# Patient Record
Sex: Male | Born: 1980
Health system: Southern US, Community
[De-identification: ages and names within clinical notes are randomized; demographics above are authoritative.]

## PROBLEM LIST (undated history)

## (undated) DIAGNOSIS — R7303 Prediabetes: Secondary | ICD-10-CM

## (undated) DIAGNOSIS — Z8669 Personal history of other diseases of the nervous system and sense organs: Secondary | ICD-10-CM

## (undated) DIAGNOSIS — K5900 Constipation, unspecified: Secondary | ICD-10-CM

## (undated) DIAGNOSIS — K219 Gastro-esophageal reflux disease without esophagitis: Secondary | ICD-10-CM

## (undated) DIAGNOSIS — I10 Essential (primary) hypertension: Secondary | ICD-10-CM

## (undated) DIAGNOSIS — N433 Hydrocele, unspecified: Secondary | ICD-10-CM

## (undated) DIAGNOSIS — E669 Obesity, unspecified: Secondary | ICD-10-CM

## (undated) DIAGNOSIS — E119 Type 2 diabetes mellitus without complications: Secondary | ICD-10-CM

## (undated) HISTORY — DX: Prediabetes: R73.03

## (undated) HISTORY — PX: NO PAST SURGERIES: SHX2092

---

## 2011-05-12 ENCOUNTER — Encounter: Payer: Self-pay | Admitting: Emergency Medicine

## 2011-05-12 ENCOUNTER — Emergency Department (HOSPITAL_COMMUNITY)
Admission: EM | Admit: 2011-05-12 | Discharge: 2011-05-12 | Disposition: A | Discharge status: Home / Self Care | Payer: Self-pay | Attending: Emergency Medicine | Admitting: Emergency Medicine

## 2011-05-12 DIAGNOSIS — R05 Cough: Secondary | ICD-10-CM | POA: Insufficient documentation

## 2011-05-12 DIAGNOSIS — R197 Diarrhea, unspecified: Secondary | ICD-10-CM | POA: Insufficient documentation

## 2011-05-12 DIAGNOSIS — B349 Viral infection, unspecified: Secondary | ICD-10-CM

## 2011-05-12 DIAGNOSIS — J3489 Other specified disorders of nose and nasal sinuses: Secondary | ICD-10-CM | POA: Insufficient documentation

## 2011-05-12 DIAGNOSIS — R6889 Other general symptoms and signs: Secondary | ICD-10-CM | POA: Insufficient documentation

## 2011-05-12 DIAGNOSIS — B9789 Other viral agents as the cause of diseases classified elsewhere: Secondary | ICD-10-CM | POA: Insufficient documentation

## 2011-05-12 DIAGNOSIS — R109 Unspecified abdominal pain: Secondary | ICD-10-CM | POA: Insufficient documentation

## 2011-05-12 DIAGNOSIS — R0989 Other specified symptoms and signs involving the circulatory and respiratory systems: Secondary | ICD-10-CM | POA: Insufficient documentation

## 2011-05-12 DIAGNOSIS — R059 Cough, unspecified: Secondary | ICD-10-CM | POA: Insufficient documentation

## 2011-05-12 DIAGNOSIS — R070 Pain in throat: Secondary | ICD-10-CM | POA: Insufficient documentation

## 2011-05-12 MED ORDER — HYDROCOD POLST-CHLORPHEN POLST 10-8 MG/5ML PO LQCR: 5.0000 mL | Freq: Two times a day (BID) | ORAL | Status: DC | PRN

## 2011-05-12 NOTE — ED Provider Notes (Signed)
Medical screening examination/treatment/procedure(s) were performed by non-physician practitioner and as supervising physician I was immediately available for consultation/collaboration.  Kadejah Sandiford, MD 05/12/11 1754 

## 2011-05-12 NOTE — ED Notes (Signed)
Pt here with body aches and chills with congestion and cough x 4 days

## 2011-05-12 NOTE — ED Provider Notes (Signed)
History     CSN: [id number]  Arrival date & time 05/12/11  4098   First MD Initiated Contact with Patient 05/12/11 [phone]      Chief Complaint  Patient presents with  . Influenza    (Consider location/radiation/quality/duration/timing/severity/associated sxs/prior treatment) HPI  31 year old male presenting to the ED with flulike symptoms. Patient has been complaining of rhinorrhea, sore throat, sneezing, dry cough, abdominal discomfort, chest congestion, and diarrhea for the past 5 days. Patient states that his symptoms are improving however he continues to endorse generalized body aches. He is able to tolerate by mouth and able to keep his food down. His contact in ibuprofen at home without adequate relief. He continues to endorse a dry cough. He has not had his flu shot this year. He denies fever, or rash.  History reviewed. No pertinent past medical history.  History reviewed. No pertinent past surgical history.  History reviewed. No pertinent family history.  History  Substance Use Topics  . Smoking status: Current Everyday Smoker  . Smokeless tobacco: Not on file  . Alcohol Use: No      Review of Systems  All other systems reviewed and are negative.    Allergies  Review of patient's allergies indicates no known allergies.  Home Medications   Current Outpatient Rx  Name Route Sig Dispense Refill  . IBUPROFEN 200 MG PO TABS Oral Take 800 mg by mouth 2 (two) times daily as needed. For pain       BP 152/92  Pulse 79  Temp(Src) 99 F (37.2 C) (Oral)  Resp 18  SpO2 96%  Physical Exam  Nursing note and vitals reviewed. Constitutional: He appears well-developed and well-nourished. No distress.       Awake, alert, nontoxic appearance  HENT:  Head: Atraumatic.  Right Ear: External ear normal.  Left Ear: External ear normal.  Nose: Rhinorrhea present.  Mouth/Throat: Oropharynx is clear and moist. No oropharyngeal exudate.  Eyes: Right eye exhibits no  discharge. Left eye exhibits no discharge.  Neck: Neck supple.  Pulmonary/Chest: Effort normal. He exhibits no tenderness.  Abdominal: Soft. There is no tenderness. There is no rebound.  Musculoskeletal: He exhibits no tenderness.       Baseline ROM, no obvious new focal weakness  Neurological: He is alert.       Mental status and motor strength appears baseline for patient and situation  Skin: No rash noted.  Psychiatric: He has a normal mood and affect.    ED Course  Procedures (including critical care time)  Labs Reviewed - No data to display No results found.   No diagnosis found.    MDM  Upper respiratory infection likely. Patient is afebrile. His vital signs stable. He is able to tolerate by mouth. Reassurance given. The patient requests for a work note.  Will prescribe cough medicine.          Fayrene Helper, [place] 05/12/11 [phone]

## 2011-08-04 ENCOUNTER — Encounter (HOSPITAL_COMMUNITY): Payer: Self-pay | Admitting: Emergency Medicine

## 2011-08-04 ENCOUNTER — Emergency Department (HOSPITAL_COMMUNITY)
Admission: EM | Admit: 2011-08-04 | Discharge: 2011-08-04 | Disposition: A | Discharge status: Home / Self Care | Payer: 59 | Attending: Emergency Medicine | Admitting: Emergency Medicine

## 2011-08-04 ENCOUNTER — Emergency Department (HOSPITAL_COMMUNITY): Payer: 59

## 2011-08-04 DIAGNOSIS — Y92009 Unspecified place in unspecified non-institutional (private) residence as the place of occurrence of the external cause: Secondary | ICD-10-CM | POA: Insufficient documentation

## 2011-08-04 DIAGNOSIS — S93401A Sprain of unspecified ligament of right ankle, initial encounter: Secondary | ICD-10-CM

## 2011-08-04 DIAGNOSIS — W010XXA Fall on same level from slipping, tripping and stumbling without subsequent striking against object, initial encounter: Secondary | ICD-10-CM | POA: Insufficient documentation

## 2011-08-04 DIAGNOSIS — S93409A Sprain of unspecified ligament of unspecified ankle, initial encounter: Secondary | ICD-10-CM | POA: Insufficient documentation

## 2011-08-04 MED ORDER — TRAMADOL HCL 50 MG PO TABS: 50.0000 mg | ORAL_TABLET | Freq: Four times a day (QID) | ORAL | Status: AC | PRN
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
## 2011-08-04 NOTE — Progress Notes (Signed)
Orthopedic Tech Progress Note Patient Details:  Ricky Zuniga 1981/01/13 [id number]  Other Ortho Devices Type of Ortho Device: Crutches;ASO Ortho Device Interventions: Application;Adjustment   Shawnie Pons 08/04/2011, 9:43 AM

## 2011-08-04 NOTE — Discharge Instructions (Signed)
Ankle Sprain An ankle sprain is an injury to the strong, fibrous tissues (ligaments) that hold the bones of your ankle joint together.  CAUSES Ankle sprain usually is caused by a fall or by twisting your ankle. People who participate in sports are more prone to these types of injuries.  SYMPTOMS  Symptoms of ankle sprain include:  Pain in your ankle. The pain may be present at rest or only when you are trying to stand or walk.   Swelling.   Bruising. Bruising may develop immediately or within 1 to 2 days after your injury.   Difficulty standing or walking.  DIAGNOSIS  Your caregiver will ask you details about your injury and perform a physical exam of your ankle to determine if you have an ankle sprain. During the physical exam, your caregiver will press and squeeze specific areas of your foot and ankle. Your caregiver will try to move your ankle in certain ways. An X-ray exam may be done to be sure a bone was not broken or a ligament did not separate from one of the bones in your ankle (avulsion).  TREATMENT  Certain types of braces can help stabilize your ankle. Your caregiver can make a recommendation for this. Your caregiver may recommend the use of medication for pain. If your sprain is severe, your caregiver may refer you to a surgeon who helps to restore function to parts of your skeletal system (orthopedist) or a physical therapist. HOME CARE INSTRUCTIONS  Apply ice to your injury for 1 to 2 days or as directed by your caregiver. Applying ice helps to reduce inflammation and pain.  Put ice in a plastic bag.   Place a towel between your skin and the bag.   Leave the ice on for 15 to 20 minutes at a time, every 2 hours while you are awake.   Take over-the-counter or prescription medicines for pain, discomfort, or fever only as directed by your caregiver.   Keep your injured leg elevated, when possible, to lessen swelling.   If your caregiver recommends crutches, use them as  instructed. Gradually, put weight on the affected ankle. Continue to use crutches or a cane until you can walk without feeling pain in your ankle.   If you have a plaster splint, wear the splint as directed by your caregiver. Do not rest it on anything harder than a pillow the first 24 hours. Do not put weight on it. Do not get it wet. You may take it off to take a shower or bath.   You may have been given an elastic bandage to wear around your ankle to provide support. If the elastic bandage is too tight (you have numbness or tingling in your foot or your foot becomes cold and blue), adjust the bandage to make it comfortable.   If you have an air splint, you may blow more air into it or let air out to make it more comfortable. You may take your splint off at night and before taking a shower or bath.   Wiggle your toes in the splint several times per day if you are able.  SEEK MEDICAL CARE IF:   You have an increase in bruising, swelling, or pain.   Your toes feel cold.   Pain relief is not achieved with medication.  SEEK IMMEDIATE MEDICAL CARE IF: Your toes are numb or blue or you have severe pain. MAKE SURE YOU:   Understand these instructions.   Will watch your condition.     Will get help right away if you are not doing well or get worse.  Document Released: 04/21/2005 Document Revised: 04/10/2011 Document Reviewed: 11/24/2007 ExitCare Patient Information 2012 [employer]. 

## 2011-08-04 NOTE — ED Provider Notes (Signed)
Medical screening examination/treatment/procedure(s) were performed by non-physician practitioner and as supervising physician I was immediately available for consultation/collaboration.  Hardin Hardenbrook, MD 08/04/11 1529 

## 2011-08-04 NOTE — ED Provider Notes (Signed)
History     CSN: [id number]  Arrival date & time 08/04/11  0849   First MD Initiated Contact with Patient 08/04/11 0902      8:54AM HPI Patient reports 2 nights ago he was walking on his porch when he tripped and twisted his right ankle falling off a porch. Reports since then has had persistent pain at the right lateral ankle. Reports swelling above his right lateral malleolus. Denies numbness, tingling, weakness, inability to bear weight, decreased range of motion. Patient is a 31 y.o. male presenting with ankle pain. The history is provided by the patient.  Ankle Pain  The incident occurred 2 days ago. The incident occurred at home. The injury mechanism was a fall and torsion. The pain is present in the right ankle. The quality of the pain is described as aching. The pain is moderate. The pain has been constant since onset. Pertinent negatives include no numbness, no inability to bear weight, no loss of motion, no muscle weakness, no loss of sensation and no tingling. He reports no foreign bodies present. The symptoms are aggravated by activity, bearing weight and palpation. He has tried immobilization, rest and elevation for the symptoms. The treatment provided no relief.    History reviewed. No pertinent past medical history.  History reviewed. No pertinent past surgical history.  History reviewed. No pertinent family history.  History  Substance Use Topics  . Smoking status: Current Everyday Smoker  . Smokeless tobacco: Not on file  . Alcohol Use: No      Review of Systems  Musculoskeletal: Negative for back pain.       Ankle pain and swelling  Skin: Negative for color change, pallor and rash.  Neurological: Negative for tingling, weakness and numbness.  All other systems reviewed and are negative.    Allergies  Review of patient's allergies indicates no known allergies.  Home Medications   Current Outpatient Rx  Name Route Sig Dispense Refill  . IBUPROFEN 200 MG PO  TABS Oral Take 800 mg by mouth 2 (two) times daily as needed. For pain       BP 138/93  Pulse 72  Temp(Src) 98.1 F (36.7 C) (Oral)  Resp 20  SpO2 97%  Physical Exam  Constitutional: He is oriented to person, place, and time. He appears well-developed and well-nourished.  HENT:  Head: Normocephalic and atraumatic.  Eyes: Pupils are equal, round, and reactive to light.  Musculoskeletal:       Right ankle: He exhibits swelling. He exhibits normal range of motion, no ecchymosis, no deformity, no laceration and normal pulse. Tenderness: superior to lateral malleolus  No lateral malleolus, no medial malleolus, no AITFL, no CF ligament, no posterior TFL, no head of 5th metatarsal and no proximal fibula tenderness found. Achilles tendon normal.       Feet:  Neurological: He is alert and oriented to person, place, and time.  Skin: Skin is warm and dry. No rash noted. No erythema. No pallor.  Psychiatric: He has a normal mood and affect. His behavior is normal.    ED Course  Procedures   Dg Ankle Complete Right  08/04/2011  *RADIOLOGY REPORT*  Clinical Data: Pain and swelling secondary to a twisting injury.  RIGHT ANKLE - COMPLETE 3+ VIEW  Comparison: None.  Findings: There is no fracture or dislocation.  Soft tissue swelling around the ankle joint with a small ankle effusion.  IMPRESSION: No acute osseous abnormality.  Ankle effusion.  Original Report Authenticated By: Allayne Gitelman.  MAXWELL, M.D.     MDM   Will place patient in an ASO and crutches. Patient likely has a right ankle sprain. Advised followup with workup if persistent pain despite conservative therapy. Patient agrees to plan and is ready for discharge      Thomasene Lot, Cordelia Poche 08/04/11 9147

## 2011-08-04 NOTE — ED Notes (Signed)
Pt fell off porch on Sunday morning.  Denies LOC.  Reports (R) ankle pain and swelling-sensation, cap refill present.  Pt ambulatory with a limp.  Pt has ace bandage on ankle.

## 2013-01-13 ENCOUNTER — Encounter (HOSPITAL_COMMUNITY): Payer: Self-pay | Admitting: Emergency Medicine

## 2013-01-13 ENCOUNTER — Emergency Department (HOSPITAL_COMMUNITY)
Admission: EM | Admit: 2013-01-13 | Discharge: 2013-01-13 | Disposition: A | Discharge status: Home / Self Care | Payer: No Typology Code available for payment source | Attending: Emergency Medicine | Admitting: Emergency Medicine

## 2013-01-13 ENCOUNTER — Emergency Department (HOSPITAL_COMMUNITY): Payer: No Typology Code available for payment source

## 2013-01-13 DIAGNOSIS — F17200 Nicotine dependence, unspecified, uncomplicated: Secondary | ICD-10-CM | POA: Insufficient documentation

## 2013-01-13 DIAGNOSIS — Y9389 Activity, other specified: Secondary | ICD-10-CM | POA: Insufficient documentation

## 2013-01-13 DIAGNOSIS — I10 Essential (primary) hypertension: Secondary | ICD-10-CM | POA: Insufficient documentation

## 2013-01-13 DIAGNOSIS — M542 Cervicalgia: Secondary | ICD-10-CM

## 2013-01-13 DIAGNOSIS — Y92410 Unspecified street and highway as the place of occurrence of the external cause: Secondary | ICD-10-CM | POA: Insufficient documentation

## 2013-01-13 DIAGNOSIS — M549 Dorsalgia, unspecified: Secondary | ICD-10-CM

## 2013-01-13 DIAGNOSIS — S0993XA Unspecified injury of face, initial encounter: Secondary | ICD-10-CM | POA: Insufficient documentation

## 2013-01-13 DIAGNOSIS — IMO0002 Reserved for concepts with insufficient information to code with codable children: Secondary | ICD-10-CM | POA: Insufficient documentation

## 2013-01-13 HISTORY — DX: Essential (primary) hypertension: I10

## 2013-01-13 MED ORDER — CYCLOBENZAPRINE HCL 5 MG PO TABS: 5.0000 mg | ORAL_TABLET | Freq: Three times a day (TID) | ORAL | Status: DC | PRN
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
## 2013-01-13 NOTE — Progress Notes (Signed)
pcp is [facility] with Dr Mosie Epstein and T anderson-mclinton, NP EPIC updated
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
## 2013-01-16 NOTE — ED Provider Notes (Signed)
Medical screening examination/treatment/procedure(s) were performed by non-physician practitioner and as supervising physician I was immediately available for consultation/collaboration.  Alline Pio N Raford Brissett, DO 01/16/13 1400 

## 2013-05-19 ENCOUNTER — Encounter: Payer: Self-pay | Admitting: Family Medicine

## 2013-05-20 ENCOUNTER — Ambulatory Visit (INDEPENDENT_AMBULATORY_CARE_PROVIDER_SITE_OTHER): Payer: 59 | Admitting: Family Medicine

## 2013-05-20 ENCOUNTER — Encounter: Payer: Self-pay | Admitting: Family Medicine

## 2013-05-20 VITALS — BP 139/92 | HR 62 | Temp 97.6°F | Ht 67.0 in | Wt 286.0 lb

## 2013-05-20 DIAGNOSIS — R030 Elevated blood-pressure reading, without diagnosis of hypertension: Secondary | ICD-10-CM

## 2013-05-20 DIAGNOSIS — IMO0001 Reserved for inherently not codable concepts without codable children: Secondary | ICD-10-CM

## 2013-05-20 DIAGNOSIS — K6289 Other specified diseases of anus and rectum: Secondary | ICD-10-CM

## 2013-05-20 MED ORDER — HYDROXYPROPYL METHYLCELLULOSE POWD: Status: DC

## 2013-05-20 NOTE — Patient Instructions (Addendum)
Ricky Zuniga, it was a pleasure seeing you today. Today we talked about your pain with defecation. I could not see any hemorrhoids during my examination. That is not saying it is not possible, but that there may be other reasons you are having this pain. As we talked about, please try to eat a higher fiber diet, including fruits, vegetables, whole grains. I have also prescribed a fiber supplement for you to take up to 3 times a day (mix one tablespoon in 8oz of fluid). I have included some information for you regarding fiber in foods.   We also talked about your smoking and you expressed a willingness to quit by February 28th. I hope you achieve your goal and will be here for support.  Please see me in two weeks to assess if you have improved and to address other medical topics.   If you have any questions or concerns, please do not hesitate to call the office at [phone].  Sincerely,  Jacquelin Hawkingalph Sahmya Arai, MD   Fiber Content in Foods Drinking plenty of fluids and consuming foods high in fiber can help with constipation. See the list below for the fiber content of some common foods. Starches and Grains / Dietary Fiber (g)  Cheerios, 1 cup / 3 g  Kellogg's Corn Flakes, 1 cup / 0.7 g  Rice Krispies, 1  cup / 0.3 g  Quaker Oat Life Cereal,  cup / 2.1 g  Oatmeal, instant (cooked),  cup / 2 g  Kellogg's Frosted Mini Wheats, 1 cup / 5.1 g  Rice, brown, long-grain (cooked), 1 cup / 3.5 g  Rice, white, long-grain (cooked), 1 cup / 0.6 g  Macaroni, cooked, enriched, 1 cup / 2.5 g Legumes / Dietary Fiber (g)  Beans, baked, canned, plain or vegetarian,  cup / 5.2 g  Beans, kidney, canned,  cup / 6.8 g  Beans, pinto, dried (cooked),  cup / 7.7 g  Beans, pinto, canned,  cup / 5.5 g Breads and Crackers / Dietary Fiber (g)  Graham crackers, plain or honey, 2 squares / 0.7 g  Saltine crackers, 3 squares / 0.3 g  Pretzels, plain, salted, 10 pieces / 1.8 g  Bread, whole-wheat,  1 slice / 1.9 g  Bread, white, 1 slice / 0.7 g  Bread, raisin, 1 slice / 1.2 g  Bagel, plain, 3 oz / 2 g  Tortilla, flour, 1 oz / 0.9 g  Tortilla, corn, 1 small / 1.5 g  Bun, hamburger or hotdog, 1 small / 0.9 g Fruits / Dietary Fiber (g)  Apple, raw with skin, 1 medium / 4.4 g  Applesauce, sweetened,  cup / 1.5 g  Banana,  medium / 1.5 g  Grapes, 10 grapes / 0.4 g  Orange, 1 small / 2.3 g  Raisin, 1.5 oz / 1.6 g  Melon, 1 cup / 1.4 g Vegetables / Dietary Fiber (g)  Green beans, canned,  cup / 1.3 g  Carrots (cooked),  cup / 2.3 g  Broccoli (cooked),  cup / 2.8 g  Peas, frozen (cooked),  cup / 4.4 g  Potatoes, mashed,  cup / 1.6 g  Lettuce, 1 cup / 0.5 g  Corn, canned,  cup / 1.6 g  Tomato,  cup / 1.1 g Document Released: 09/07/2006 Document Revised: 07/14/2011 Document Reviewed: 11/02/2006 [employer] Patient Information 2014 [employer] [place].

## 2013-05-20 NOTE — Progress Notes (Signed)
   Subjective:    Patient ID: Ricky Zuniga, male    DOB: 03/07/1981, 33 y.o.   MRN: [id number]  HPI  Painful defecation Patient presents today with a three month history of pain with bowel movements. He describes the pain as sharp pain located in his rectum. He has associated diffuse abdominal pain that resolves when rectal pain resolves. He has no hematochezia. Stools are difficult but come out soft. He has a prior history of hemorrhoids in his teens and in his twenties with symptoms improved through diet modification and stool softeners. He has tried bathing and Preparation H with minimal relief. He reports no fevers, weight loss, history of cancer or family history of cancer.  Elevated blood pressure Blood pressure slightly elevated today. Patient given a prescription for amlodipine previously that he never started and has been trying to manage his blood pressure with diet modification. He reports no headaches, chest pain, or leg swelling.  Review of Systems     Objective:   Physical Exam  Vitals reviewed. Constitutional: He appears well-developed and well-nourished.  Cardiovascular: Normal rate, regular rhythm and normal heart sounds.   No murmur heard. Pulmonary/Chest: Effort normal and breath sounds normal.  Abdominal: Bowel sounds are normal. There is no tenderness.  Genitourinary: Rectum normal and prostate normal. Rectal exam shows no external hemorrhoid, no internal hemorrhoid, no fissure and no mass.          Assessment & Plan:

## 2013-05-21 DIAGNOSIS — R030 Elevated blood-pressure reading, without diagnosis of hypertension: Secondary | ICD-10-CM

## 2013-05-21 DIAGNOSIS — IMO0001 Reserved for inherently not codable concepts without codable children: Secondary | ICD-10-CM | POA: Insufficient documentation

## 2013-05-21 DIAGNOSIS — K6289 Other specified diseases of anus and rectum: Secondary | ICD-10-CM | POA: Insufficient documentation

## 2013-05-21 NOTE — Assessment & Plan Note (Signed)
Blood pressure slightly elevated. Will follow-up blood pressure at next office visit. Encouraging lifestyle modification for now.

## 2013-05-21 NOTE — Assessment & Plan Note (Signed)
Does not appear to have hemorrhoids. Low suspicion for cancer but will continue to follow-up symptoms. Conservative management for now with increased fiber diet and fiber supplement.

## 2013-06-03 ENCOUNTER — Ambulatory Visit: Payer: 59 | Admitting: Family Medicine

## 2014-05-19 ENCOUNTER — Encounter: Payer: Self-pay | Admitting: Family Medicine

## 2014-05-19 ENCOUNTER — Ambulatory Visit (INDEPENDENT_AMBULATORY_CARE_PROVIDER_SITE_OTHER): Payer: 59 | Admitting: Family Medicine

## 2014-05-19 VITALS — BP 124/91 | HR 67 | Temp 98.2°F | Ht 67.0 in | Wt 299.0 lb

## 2014-05-19 DIAGNOSIS — G44209 Tension-type headache, unspecified, not intractable: Secondary | ICD-10-CM | POA: Insufficient documentation

## 2014-05-19 DIAGNOSIS — I10 Essential (primary) hypertension: Secondary | ICD-10-CM

## 2014-05-19 DIAGNOSIS — K5900 Constipation, unspecified: Secondary | ICD-10-CM | POA: Insufficient documentation

## 2014-05-19 DIAGNOSIS — G44219 Episodic tension-type headache, not intractable: Secondary | ICD-10-CM

## 2014-05-19 MED ORDER — AMLODIPINE BESYLATE 5 MG PO TABS: 5.0000 mg | ORAL_TABLET | Freq: Every day | ORAL | Status: DC

## 2014-05-19 MED ORDER — POLYETHYLENE GLYCOL 3350 17 GM/SCOOP PO POWD: 17.0000 g | Freq: Two times a day (BID) | ORAL | Status: DC | PRN

## 2014-05-19 NOTE — Assessment & Plan Note (Addendum)
No neurological findings.  Exam and history consistent with tension headache -Continue Tylenol PRN -Counseled on use of NSAIDs/excedrin.  Caution rebound headache -BP controlled today.  However, possibility that lapse in anti-hypertensives may have played a role in headaches -Restart Norvasc -Follow up if red flag signs

## 2014-05-19 NOTE — Progress Notes (Signed)
Patient ID: Ricky Zuniga, male   DOB: 07/19/1980, 34 y.o.   MRN: [id number]    Subjective: CC: headache, htn, constipation HPI: Patient is a 34 y.o. male presenting to clinic today for headache. Concerns today include:  1. Headaches Patient states that headaches started 3 weeks ago.  Located across forehead in a band like pattern.  He reports that they tend to occur late at night after he has worked. Takes Tylenol or Excedrin for pain.  Tylenol works but takes time and Excedrin works well and quickly.  Denies dizziness, blurry vision, nausea or vomiting. Occasional photophobia.  2. HTN: Not compliant with Ricky Zuniga.  Since move, has not taken for 1 month.  Restarted about 1 week ago.  No CP, SOB, n/v, diaphoresis.  3. Constipation Patient reports difficulty stooling.  He states that he occasionally sees blood when wiping.  He has started making dietary changes, increasing spinach/ green leafy veggies and supplementing with flax seed.  Has not tried any laxatives or stool softeners.  Endorses some rectal discomfort occasionally with hard stools but no overt pain or itching.    Social History Reviewed: non smoker. FamHx and MedHx updated.  Please see EMR.  ROS: All other systems reviewed and are negative.  Objective: Office vital signs reviewed. BP 124/91 mmHg  Pulse 67  Temp(Src) 98.2 F (36.8 C) (Oral)  Ht 5\' 7"  (1.702 m)  Wt 299 lb (135.626 kg)  BMI 46.82 kg/m2  Physical Examination:  General: Awake, alert, well nourished, well appearing male, NAD HEENT: Normal, EOMI Cardio: RRR, S1S2 heard, no murmurs appreciated Pulm: CTAB, no wheezes, rhonchi or rales, no increased WOB Extremities: WWP, No edema, cyanosis or clubbing; +2 pulses bilaterally MSK: Normal gait and station Neuro: follows commands, no focal deficits  Assessment: 34 y.o. male with HTN, headache and constipation  Plan: See Problem List and After Visit Summary   Raliegh IpAshly M Gottschalk, DO PGY-1, [facility]

## 2014-05-19 NOTE — Assessment & Plan Note (Signed)
Patient given Norvasc by another provider.  Has been taking for 1 week now.  BP 124/91 today. -RF on Norvasc -Follow up for fasting labs & PE in 1 week

## 2014-05-19 NOTE — Assessment & Plan Note (Signed)
Possible hemorrhoid.  Rectum not examined today.   -Defer rectal examination to PE or if patient develops active pain/rectal bleeding -Continue diet and exercise modifications -Start Miralax 1 cap daily increase to BID if no improvement in 2 days.  May scale back to QOD if loose stools. -F/U PRN

## 2014-05-19 NOTE — Patient Instructions (Signed)
It was a pleasure seeing you today!  Information regarding what we discussed is included in this packet.  Please feel free to call our office if any questions or concerns arise.  Schedule a Physical with me.  Plan to do fasting labs, so no eating or drinking anything but water or black coffee before you come in.  We will test your sugar then.  Continue taking Norvasc for blood pressure.  Take Miralax or generic for help with constipation.  Zurisadai Helminiak M. Nadine CountsGottschalk, DO PGY-1, [facility]    High-Fiber Diet Fiber is found in fruits, vegetables, and grains. A high-fiber diet encourages the addition of more whole grains, legumes, fruits, and vegetables in your diet. The recommended amount of fiber for adult males is 38 g per day. For adult females, it is 25 g per day. Pregnant and lactating women should get 28 g of fiber per day. If you have a digestive or bowel problem, ask your caregiver for advice before adding high-fiber foods to your diet. Eat a variety of high-fiber foods instead of only a select few type of foods.  PURPOSE  To increase stool bulk.  To make bowel movements more regular to prevent constipation.  To lower cholesterol.  To prevent overeating. WHEN IS THIS DIET USED?  It may be used if you have constipation and hemorrhoids.  It may be used if you have uncomplicated diverticulosis (intestine condition) and irritable bowel syndrome.  It may be used if you need help with weight management.  It may be used if you want to add it to your diet as a protective measure against atherosclerosis, diabetes, and cancer. SOURCES OF FIBER  Whole-grain breads and cereals.  Fruits, such as apples, oranges, bananas, berries, prunes, and pears.  Vegetables, such as green peas, carrots, sweet potatoes, beets, broccoli, cabbage, spinach, and artichokes.  Legumes, such split peas, soy, lentils.  Almonds. FIBER CONTENT IN FOODS Starches and Grains / Dietary Fiber (g)  Cheerios,  1 cup / 3 g  Corn Flakes cereal, 1 cup / 0.7 g  Rice crispy treat cereal, 1 cup / 0.3 g  Instant oatmeal (cooked),  cup / 2 g  Frosted wheat cereal, 1 cup / 5.1 g  Brown, long-grain rice (cooked), 1 cup / 3.5 g  White, long-grain rice (cooked), 1 cup / 0.6 g  Enriched macaroni (cooked), 1 cup / 2.5 g Legumes / Dietary Fiber (g)  Baked beans (canned, plain, or vegetarian),  cup / 5.2 g  Kidney beans (canned),  cup / 6.8 g  Pinto beans (cooked),  cup / 5.5 g Breads and Crackers / Dietary Fiber (g)  Plain or honey graham crackers, 2 squares / 0.7 g  Saltine crackers, 3 squares / 0.3 g  Plain, salted pretzels, 10 pieces / 1.8 g  Whole-wheat bread, 1 slice / 1.9 g  White bread, 1 slice / 0.7 g  Raisin bread, 1 slice / 1.2 g  Plain bagel, 3 oz / 2 g  Flour tortilla, 1 oz / 0.9 g  Corn tortilla, 1 small / 1.5 g  Hamburger or hotdog bun, 1 small / 0.9 g Fruits / Dietary Fiber (g)  Apple with skin, 1 medium / 4.4 g  Sweetened applesauce,  cup / 1.5 g  Banana,  medium / 1.5 g  Grapes, 10 grapes / 0.4 g  Orange, 1 small / 2.3 g  Raisin, 1.5 oz / 1.6 g  Melon, 1 cup / 1.4 g Vegetables / Dietary Fiber (g)  Green beans (canned),  cup / 1.3 g  Carrots (cooked),  cup / 2.3 g  Broccoli (cooked),  cup / 2.8 g  Peas (cooked),  cup / 4.4 g  Mashed potatoes,  cup / 1.6 g  Lettuce, 1 cup / 0.5 g  Corn (canned),  cup / 1.6 g  Tomato,  cup / 1.1 g Document Released: 04/21/2005 Document Revised: 10/21/2011 Document Reviewed: 07/24/2011 ExitCare Patient Information 2015 [place], [place]. This information is not intended to replace advice given to you by your health care provider. Make sure you discuss any questions you have with your health care provider.

## 2014-05-23 ENCOUNTER — Ambulatory Visit (INDEPENDENT_AMBULATORY_CARE_PROVIDER_SITE_OTHER): Payer: 59 | Admitting: Family Medicine

## 2014-05-23 ENCOUNTER — Encounter: Payer: Self-pay | Admitting: Family Medicine

## 2014-05-23 VITALS — BP 129/77 | HR 60 | Temp 97.9°F | Ht 67.0 in | Wt 299.4 lb

## 2014-05-23 DIAGNOSIS — L309 Dermatitis, unspecified: Secondary | ICD-10-CM

## 2014-05-23 DIAGNOSIS — I10 Essential (primary) hypertension: Secondary | ICD-10-CM

## 2014-05-23 DIAGNOSIS — F17200 Nicotine dependence, unspecified, uncomplicated: Secondary | ICD-10-CM | POA: Insufficient documentation

## 2014-05-23 DIAGNOSIS — Z720 Tobacco use: Secondary | ICD-10-CM

## 2014-05-23 DIAGNOSIS — Z0000 Encounter for general adult medical examination without abnormal findings: Secondary | ICD-10-CM

## 2014-05-23 LAB — COMPREHENSIVE METABOLIC PANEL
ALK PHOS: 56 U/L (ref 39–117)
ALT: 21 U/L (ref 0–53)
AST: 18 U/L (ref 0–37)
Albumin: 3.9 g/dL (ref 3.5–5.2)
BUN: 20 mg/dL (ref 6–23)
CHLORIDE: 104 meq/L (ref 96–112)
CO2: 23 meq/L (ref 19–32)
Calcium: 9.6 mg/dL (ref 8.4–10.5)
Creat: 0.97 mg/dL (ref 0.50–1.35)
Glucose, Bld: 78 mg/dL (ref 70–99)
Potassium: 4.7 mEq/L (ref 3.5–5.3)
Sodium: 137 mEq/L (ref 135–145)
Total Bilirubin: 0.5 mg/dL (ref 0.2–1.2)
Total Protein: 7 g/dL (ref 6.0–8.3)

## 2014-05-23 LAB — LIPID PANEL
CHOL/HDL RATIO: 2.5 ratio
CHOLESTEROL: 141 mg/dL (ref 0–200)
HDL: 57 mg/dL (ref >39)
LDL Cholesterol: 75 mg/dL (ref 0–99)
TRIGLYCERIDES: 47 mg/dL (ref <150)
VLDL: 9 mg/dL (ref 0–40)

## 2014-05-23 MED ORDER — TRIAMCINOLONE ACETONIDE 0.5 % EX OINT: 1.0000 "application " | TOPICAL_OINTMENT | Freq: Two times a day (BID) | CUTANEOUS | Status: DC

## 2014-05-23 MED ORDER — AMLODIPINE BESYLATE 5 MG PO TABS: 5.0000 mg | ORAL_TABLET | Freq: Every day | ORAL | Status: DC

## 2014-05-23 NOTE — Progress Notes (Signed)
Patient ID: Ricky Zuniga, male   DOB: 12/05/1980, 34 y.o.   MRN: [id number] Subjective:     Lois Groll is a 34 y.o. male and is here for a comprehensive physical exam. The patient reports no problems.  History   Social History  . Marital Status: Married    Spouse Name: N/A    Number of Children: N/A  . Years of Education: N/A   Occupational History  . Not on file.   Social History Main Topics  . Smoking status: Current Every Day Smoker    Types: Cigarettes  . Smokeless tobacco: Not on file     Comment: 1 pack per week  . Alcohol Use: No  . Drug Use: No  . Sexual Activity: Not on file   Other Topics Concern  . Not on file   Social History Narrative   Health Maintenance  Topic Date Due  . Janet BerlinETANUS/TDAP  04/08/1999  . INFLUENZA VACCINE  12/03/2013    The following portions of the patient's history were reviewed and updated as appropriate: allergies, current medications, past family history, past medical history, past social history, past surgical history and problem list.  Review of Systems Constitutional: negative Eyes: negative Ears, nose, mouth, throat, and face: negative Respiratory: negative Cardiovascular: negative Gastrointestinal: positive for constipation Genitourinary:negative Integument/breast: positive for pruritus and rash Hematologic/lymphatic: negative Musculoskeletal:negative Neurological: negative Behavioral/Psych: negative Endocrine: negative Allergic/Immunologic: negative   Objective:    BP 129/77 mmHg  Pulse 60  Temp(Src) 97.9 F (36.6 C) (Oral)  Ht 5\' 7"  (1.702 m)  Wt 299 lb 6.4 oz (135.807 kg)  BMI 46.88 kg/m2 General appearance: alert, cooperative, appears stated age, no distress and mildly obese Head: Normocephalic, without obvious abnormality, atraumatic Eyes: conjunctivae/corneas clear. PERRL, EOM's intact. Fundi benign. Ears: normal TM's and external ear canals both ears Nose: Nares normal. Septum midline. Mucosa normal. No  drainage or sinus tenderness. Throat: lips, mucosa, and tongue normal; teeth and gums normal Neck: no adenopathy, no carotid bruit, no JVD, supple, symmetrical, trachea midline and thyroid not enlarged, symmetric, no tenderness/mass/nodules Back: symmetric, no curvature. ROM normal. No CVA tenderness. Lungs: clear to auscultation bilaterally Chest wall: no tenderness Heart: regular rate and rhythm, S1, S2 normal, no murmur, click, rub or gallop Abdomen: soft, non-tender; bowel sounds normal; no masses,  no organomegaly Extremities: extremities normal, atraumatic, no cyanosis or edema Pulses: 2+ and symmetric Skin: eczema - generalized, abdomen and flank Lymph nodes: Cervical, supraclavicular, and axillary nodes normal. Neurologic: Alert and oriented X 3, normal strength and tone. Normal symmetric reflexes. Normal coordination and gait    Assessment:    Healthy male exam.      Plan:     See After Visit Summary for Counseling Recommendations    Ashly M. Nadine CountsGottschalk, DO PGY-1, [facility]

## 2014-05-23 NOTE — Patient Instructions (Addendum)
It was a pleasure seeing you today, Ricky Zuniga!  Information regarding what we discussed is included in this packet. I will contact you will the results of your labs.  If anything is abnormal, I will call you.  Otherwise, expect a copy mailed to you.  Continue Norvasc.  Your BP is looking great.  Schedule an appoint with Dr Raymondo BandKoval up front for Smoking cessation counseling and management.  Triamcinolone ointment has been sent in.  If rash does not improve in 10 days come back and see me.   Please feel free to call our office at [phone] if any questions or concerns arise.  Warm Regards, [name] [name], DO   Hypertension Hypertension is another name for high blood pressure. High blood pressure forces your heart to work harder to pump blood. A blood pressure reading has two numbers, which includes a higher number over a lower number (example: 110/72). HOME CARE   Have your blood pressure rechecked by your doctor.  Only take medicine as told by your doctor. Follow the directions carefully. The medicine does not work as well if you skip doses. Skipping doses also puts you at risk for problems.  Do not smoke.  Monitor your blood pressure at home as told by your doctor. GET HELP IF:  You think you are having a reaction to the medicine you are taking.  You have repeat headaches or feel dizzy.  You have puffiness (swelling) in your ankles.  You have trouble with your vision. GET HELP RIGHT AWAY IF:   You get a very bad headache and are confused.  You feel weak, numb, or faint.  You get chest or belly (abdominal) pain.  You throw up (vomit).  You cannot breathe very well. MAKE SURE YOU:   Understand these instructions.  Will watch your condition.  Will get help right away if you are not doing well or get worse. Document Released: 10/08/2007 Document Revised: 04/26/2013 Document Reviewed: 02/11/2013 [facility] Patient Information 2015 [place]. This information is  not intended to replace advice given to you by your health care provider. Make sure you discuss any questions you have with your health care provider.

## 2014-05-23 NOTE — Assessment & Plan Note (Signed)
Smokes 3-4 cig/day since 34 years old.  Contemplative/Action phase.  Patient is eager to quit.  Feels like it's more habitual rather than needed nicotine. -Smoking cessation counseling -Schedule appt with Dr Raymondo BandKoval for further management.  Consider Nicotrol inhaler.   -See PCP in 4-6 weeks to check in on smoking cessation.

## 2014-05-23 NOTE — Addendum Note (Signed)
Addended by: Raliegh IpGOTTSCHALK, Bertine Schlottman M on: 05/23/2014 10:02 AM   Modules accepted: Level of Service

## 2014-05-24 ENCOUNTER — Encounter: Payer: Self-pay | Admitting: Family Medicine

## 2014-06-01 ENCOUNTER — Ambulatory Visit (INDEPENDENT_AMBULATORY_CARE_PROVIDER_SITE_OTHER): Payer: 59 | Admitting: Pharmacist

## 2014-06-01 ENCOUNTER — Encounter: Payer: Self-pay | Admitting: Pharmacist

## 2014-06-01 DIAGNOSIS — F17200 Nicotine dependence, unspecified, uncomplicated: Secondary | ICD-10-CM

## 2014-06-01 DIAGNOSIS — Z720 Tobacco use: Secondary | ICD-10-CM

## 2014-06-01 MED ORDER — NICOTINE POLACRILEX 2 MG MT LOZG: 2.0000 mg | LOZENGE | OROMUCOSAL | Status: DC | PRN

## 2014-06-01 NOTE — Patient Instructions (Signed)
It was great meeting you today!  [facility].  Please take a nicotine lozenge when you have a craving for a cigarette at a maximum of 4-5 lozenges per day.  Your quit date has been set to June 19, 2014! You can do it!  Please call and make an appointment with Dr. Raymondo BandKoval in the pharmacy clinic around the beginning of March.

## 2014-06-01 NOTE — Assessment & Plan Note (Signed)
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 Initiated nicotine lozenges 2 mg to take when he has a craving throughout the day at a maximum of 4-5 lozenges/day. Quit day set to 06/19/14. Patient counseled on purpose, proper use, and potential adverse effects, including nausea. Written information provided. Provided information on 1 800-QUIT NOW support program.  Will attempt a F/U phone call on 06/20/14.  F/U Rx Clinic Visit around the first week of March. Total time in face-to-face counseling 20 minutes.  Patient seen with Tennis Mustassie Stewart, PharmD Resident.

## 2014-06-01 NOTE — Progress Notes (Signed)
S:  Patient arrives in good spirits ready to quit smoking.   Patient arrives for evaluation/assistance with tobacco dependence.  Age when started using tobacco on a daily basis is [age].   Number of Cigarettes per day is usually 1 ppd, but has cut down to 3-4 cigarettes/day within the last month. Brand smoked [place]. Estimated Nicotine Content per Cigarette 1 mg.  Estimated Nicotine intake per day 3-4 mg.   Smokes first cigarette ~60 minutes after waking. Denies waking up to smoke. Estimated Fagerstrom Score 4-5/10.  Patient has never completely quit, but recently cut down to 3-4 cigarettes/day within the last month Longest time patient has ever been tobacco free is less than a day. Patient has not tried any medications (NRT, bupropion, varenicline) in the past. Rates IMPORTANCE of quitting tobacco on 1-10 scale of 10. Rates READINESS of quitting tobacco on 1-10 scale of 10. Rates CONFIDENCE of quitting tobacco on 1-10 scale of 7. Triggers to use tobacco include when he wakes up in the morning, after he eats, while on break at work, and when he is stressed.  He would like to improve his health.  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 Initiated nicotine lozenges 2 mg to take when he has a craving throughout the day at a maximum of 4-5 lozenges/day. Quit day set to 06/19/14. Patient counseled on purpose, proper use, and potential adverse effects, including nausea. Written information provided. Provided information on 1 800-QUIT NOW support program.  Will attempt a F/U phone call on 06/20/14.  F/U Rx Clinic Visit around the first week of March. Total time in face-to-face counseling 20 minutes.  Patient seen with Tennis Mustassie Stewart, PharmD Resident.   .Marland Kitchen

## 2014-06-02 NOTE — Progress Notes (Signed)
Patient ID: Ricky FeeDarryl Zuniga, male   DOB: 02/27/1981, 34 y.o.   MRN: [id number] Reviewed: Agree with Dr. Macky LowerKoval's documentation and management.

## 2015-05-11 MED FILL — AMLODIPINE BESYLATE 5 MG TA: 5 | 30 days supply | Qty: 30 | Fill #1

## 2015-06-28 ENCOUNTER — Other Ambulatory Visit: Payer: Self-pay | Admitting: Family Medicine

## 2015-06-28 NOTE — Telephone Encounter (Signed)
One month refill. Patient needs an appointment for refills and is due for a physical

## 2015-06-29 MED FILL — AMLODIPINE BESYLATE 5 MG TA: 5 | 30 days supply | Qty: 30 | Fill #0

## 2015-07-02 NOTE — Telephone Encounter (Signed)
Patient has an appt on 07-05-15. Oneil Behney,CMA

## 2015-07-05 ENCOUNTER — Ambulatory Visit: Payer: 59 | Admitting: Family Medicine

## 2015-07-18 ENCOUNTER — Encounter (HOSPITAL_COMMUNITY): Payer: Self-pay | Admitting: *Deleted

## 2015-07-18 ENCOUNTER — Emergency Department (HOSPITAL_COMMUNITY)
Admission: EM | Admit: 2015-07-18 | Discharge: 2015-07-18 | Disposition: A | Discharge status: Home / Self Care | Payer: BC Managed Care – PPO | Source: Home / Self Care | Attending: Family Medicine | Admitting: Family Medicine

## 2015-07-18 DIAGNOSIS — B353 Tinea pedis: Secondary | ICD-10-CM | POA: Diagnosis not present

## 2015-07-18 DIAGNOSIS — J340 Abscess, furuncle and carbuncle of nose: Secondary | ICD-10-CM | POA: Diagnosis not present

## 2015-07-18 MED ORDER — MUPIROCIN CALCIUM 2 % NA OINT: TOPICAL_OINTMENT | NASAL | Status: DC

## 2015-07-18 MED ORDER — TERBINAFINE HCL 250 MG PO TABS: 250.0000 mg | ORAL_TABLET | Freq: Every day | ORAL | Status: DC

## 2015-07-18 MED ORDER — TERBINAFINE HCL 1 % EX CREA: 1.0000 "application " | TOPICAL_CREAM | Freq: Two times a day (BID) | CUTANEOUS | Status: DC

## 2015-07-18 MED FILL — TERBINAFINE HCL 250 MG TAB: 250 | 14 days supply | Qty: 14 | Fill #0

## 2015-07-18 MED FILL — MUPIROCIN 2% OINTMENT: 2 | 15 days supply | Qty: 22 | Fill #0

## 2015-07-18 NOTE — ED Notes (Signed)
Pt  Has  Two  Issues  -  He  Reports  Sores  Inside  The  Nostrils  Of  His  Nose  For  About  1  Week     he  Also  Reports  A  Trash   Oh his  feet

## 2015-07-18 NOTE — ED Provider Notes (Signed)
CSN: [id number]     Arrival date & time 07/18/15  1356 History   None    Chief Complaint  Patient presents with  . Nose Problem   (Consider location/radiation/quality/duration/timing/severity/associated sxs/prior Treatment) Patient is a 35 y.o. male presenting with rash. The history is provided by the patient.  Rash Location:  Face and foot Facial rash location:  Nose Foot rash location:  R toes and L toes Quality: painful, redness and scaling   Pain details:    Severity:  Mild   Duration:  1 week   Progression:  Worsening Severity:  Moderate Onset quality:  Gradual Chronicity:  New Context comment:  Nasal sores and athlete foot rashes. Relieved by:  None tried Worsened by:  Nothing tried Ineffective treatments:  None tried Associated symptoms: no fever     Past Medical History  Diagnosis Date  . Hypertension    History reviewed. No pertinent past surgical history. Family History  Problem Relation Age of Onset  . Hypertension Mother   . Diabetes Father   . Stroke Father   . Hypertension Sister   . Asthma Son    Social History  Substance Use Topics  . Smoking status: Current Every Day Smoker    Types: Cigarettes  . Smokeless tobacco: None     Comment: Cut down to 3-4 cigarettes/day  . Alcohol Use: No    Review of Systems  Constitutional: Negative.  Negative for fever.  Skin: Positive for rash.  All other systems reviewed and are negative.   Allergies  Review of patient's allergies indicates no known allergies.  Home Medications   Prior to Admission medications   Medication Sig Start Date End Date Taking? Authorizing Provider  amLODipine (NORVASC) 5 MG tablet TAKE 1 TABLET (5 MG TOTAL) BY MOUTH DAILY. 06/28/15   Narda Bonds, MD  aspirin-acetaminophen-caffeine (EXCEDRIN MIGRAINE) [phone] MG per tablet Take 2 tablets by mouth every 6 (six) hours as needed for pain.    Historical Provider, MD  mupirocin nasal ointment (BACTROBAN) 2 % Apply in each  nostril twice daily 07/18/15   Linna Hoff, MD  nicotine polacrilex (COMMIT) 2 MG lozenge Take 1 lozenge (2 mg total) by mouth as needed for smoking cessation. 06/01/14   Moses Manners, MD  polyethylene glycol powder (GLYCOLAX/MIRALAX) powder Take 17 g by mouth 2 (two) times daily as needed. 05/19/14   Ashly Hulen Skains, DO  terbinafine (LAMISIL) 1 % cream Apply 1 application topically 2 (two) times daily. 07/18/15   Linna Hoff, MD  terbinafine (LAMISIL) 250 MG tablet Take 1 tablet (250 mg total) by mouth daily. 07/18/15   Linna Hoff, MD  triamcinolone ointment (KENALOG) 0.5 % Apply 1 application topically 2 (two) times daily. 05/23/14   Raliegh Ip, DO   Meds Ordered and Administered this Visit  Medications - No data to display  BP 142/86 mmHg  Pulse 78  Temp(Src) 98.6 F (37 C) (Oral)  Resp 16  SpO2 99% No data found.   Physical Exam  Constitutional: He is oriented to person, place, and time. He appears well-developed and well-nourished. No distress.  HENT:  Right Ear: External ear normal.  Left Ear: External ear normal.  Nose:    Mouth/Throat: Oropharynx is clear and moist.  Neck: Normal range of motion. Neck supple.  Musculoskeletal: He exhibits tenderness.  Dry tender bilat foot rash, moist between toes.  Lymphadenopathy:    He has no cervical adenopathy.  Neurological: He is alert and oriented  to person, place, and time.  Skin: Skin is warm and dry. Rash noted.  Nursing note and vitals reviewed.   ED Course  Procedures (including critical care time)  Labs Review Labs Reviewed - No data to display  Imaging Review No results found.   Visual Acuity Review  Right Eye Distance:   Left Eye Distance:   Bilateral Distance:    Right Eye Near:   Left Eye Near:    Bilateral Near:         MDM   1. Tinea pedis of both feet   2. Cellulitis of nasal tip       Linna HoffJames D Sinclair Arrazola, MD 07/23/15 1517

## 2015-07-18 NOTE — Discharge Instructions (Signed)
Use medicine as prescribed. °

## 2015-07-31 ENCOUNTER — Encounter: Payer: Self-pay | Admitting: Behavioral Health

## 2015-07-31 ENCOUNTER — Telehealth: Payer: Self-pay | Admitting: Behavioral Health

## 2015-07-31 NOTE — Telephone Encounter (Signed)
Pre-Visit Call completed with patient and chart updated.   Pre-Visit Info documented in Specialty Comments under [employer].    

## 2015-08-01 ENCOUNTER — Ambulatory Visit (INDEPENDENT_AMBULATORY_CARE_PROVIDER_SITE_OTHER): Payer: BC Managed Care – PPO | Admitting: Family Medicine

## 2015-08-01 ENCOUNTER — Encounter: Payer: Self-pay | Admitting: Family Medicine

## 2015-08-01 VITALS — BP 146/98 | HR 72 | Temp 98.5°F | Ht 67.0 in | Wt 317.4 lb

## 2015-08-01 DIAGNOSIS — E669 Obesity, unspecified: Secondary | ICD-10-CM | POA: Diagnosis not present

## 2015-08-01 DIAGNOSIS — L309 Dermatitis, unspecified: Secondary | ICD-10-CM | POA: Diagnosis not present

## 2015-08-01 DIAGNOSIS — I10 Essential (primary) hypertension: Secondary | ICD-10-CM | POA: Diagnosis not present

## 2015-08-01 MED ORDER — TRIAMCINOLONE ACETONIDE 0.1 % EX CREA: 1.0000 "application " | TOPICAL_CREAM | Freq: Two times a day (BID) | CUTANEOUS | Status: DC

## 2015-08-01 MED ORDER — AMLODIPINE BESYLATE 10 MG PO TABS: 10.0000 mg | ORAL_TABLET | Freq: Every day | ORAL | Status: DC

## 2015-08-01 MED FILL — AMLODIPINE BESYLATE 10 MG T: 10 | 90 days supply | Qty: 90 | Fill #0 | Status: TO

## 2015-08-01 MED FILL — TRIAMCINOLONE 0.1% CREAM: 0.1 | 30 days supply | Qty: 80 | Fill #0 | Status: TO

## 2015-08-01 NOTE — Progress Notes (Signed)
[facility] at [facility] [address], [address] [place], [place] [address] [phone]  Date:  08/01/2015   Name:  Ricky Zuniga   DOB:  1981-02-20   MRN:  [id number]  PCP:  Abbe Amsterdam, MD    Chief Complaint: New Patient (Initial Visit)   History of Present Illness:  Ricky Zuniga is a 35 y.o. very pleasant male patient who presents with the following:  Here today to establish care.   [facility] but would like to start seeing this practice  He has had HTN for a year or so.  He is still taking norvasc  and has not noted any SE from this medication.   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   He just ate so cannot do fasting labs today  BP Readings from Last 3 Encounters:  08/01/15 146/98  07/18/15 142/86  06/01/14 135/90   He does tend to get some headaches.   Patient Active Problem List   Diagnosis Date Noted  . Eczema 05/23/2014  . Tobacco use disorder 05/23/2014  . Constipation 05/19/2014  . HTN (hypertension) 05/19/2014  . Rectal pain 05/21/2013  . Elevated blood pressure 05/21/2013    Past Medical History  Diagnosis Date  . Hypertension   . Pre-diabetes     Past Surgical History  Procedure Laterality Date  . No past surgeries      Social History  Substance Use Topics  . Smoking status: Former Smoker -- 15 years    Types: Cigarettes    Quit date: 07/30/2015  . Smokeless tobacco: Never Used     Comment: Cut down to 3-4 cigarettes/day  . Alcohol Use: No    Family History  Problem Relation Age of Onset  . Hypertension Mother   . Diabetes Father   . Stroke Father   . Hypertension Sister   . Asthma Son     No Known Allergies  Medication list has been reviewed and updated.  Current Outpatient Prescriptions on File Prior to Visit  Medication Sig Dispense Refill  .  amLODipine (NORVASC) 5 MG tablet TAKE 1 TABLET (5 MG TOTAL) BY MOUTH DAILY. 30 tablet 0  . aspirin-acetaminophen-caffeine (EXCEDRIN MIGRAINE) 250-250-65 MG per tablet Take 2 tablets by mouth every 6 (six) hours as needed for pain.    . mupirocin nasal ointment (BACTROBAN) 2 % Apply in each nostril twice daily 10 g 1  . polyethylene glycol powder (GLYCOLAX/MIRALAX) powder Take 17 g by mouth 2 (two) times daily as needed. 3350 g 1  . terbinafine (LAMISIL) 1 % cream Apply 1 application topically 2 (two) times daily. 42 g 1   No current facility-administered medications on file prior to visit.    Review of Systems:  As per HPI- otherwise negative.   Physical Examination: Filed Vitals:   08/01/15 1520  BP: 146/98  Pulse: 72  Temp: 98.5 F (36.9 C)     GEN: WDWN, NAD, Non-toxic, A & O x 3, obese, mild eczema on his arms HEENT: Atraumatic, Normocephalic. Neck supple. No masses, No LAD.  Bilateral TM wnl, oropharynx normal.  PEERL,EOMI.   Ears and Nose: No external deformity. CV: RRR, No M/G/R. No JVD. No thrill. No extra heart sounds. PULM: CTA B, no wheezes, crackles, rhonchi. No retractions. No resp. distress. No accessory muscle use.  EXTR: No c/c/e NEURO Normal gait.  PSYCH: Normally interactive. Conversant. Not depressed or anxious appearing.  Calm demeanor.  Mild eczema on extensor surfaces of arms, left more than right   Assessment and Plan: Essential hypertension - Plan: amLODipine (NORVASC) 10 MG tablet  Eczema - Plan: triamcinolone cream (KENALOG) 0.1 %  Obesity  Here today to establish care.  His BP is incompletely controlled on 5mg  of norvasc. Will increase to 10 mg Plan to follow-up for a CPE and fasting labs in the next few months  Refilled his triamcinolone and discussed eczema self- care  Signed Abbe AmsterdamJessica Kynzlee Hucker, MD

## 2015-08-01 NOTE — Patient Instructions (Signed)
Let's increase your norvasc to 10 mg a day for better BP control Use the triamcinolone as needed for your eczema- it is also important to moisturize!   Please schedule a physical with me in a few months- I would also like to do FASTING labs at that visit

## 2015-10-29 ENCOUNTER — Encounter: Payer: BC Managed Care – PPO | Admitting: Family Medicine

## 2015-10-29 ENCOUNTER — Telehealth: Payer: Self-pay | Admitting: Family Medicine

## 2015-10-29 DIAGNOSIS — Z0289 Encounter for other administrative examinations: Secondary | ICD-10-CM

## 2015-10-29 NOTE — Telephone Encounter (Signed)
Pt lvm at 8:22 informing PCP that he will not be at his appt. Should pt be charged?

## 2015-10-29 NOTE — Telephone Encounter (Signed)
yes

## 2015-11-02 ENCOUNTER — Encounter: Payer: Self-pay | Admitting: Family Medicine

## 2016-10-17 ENCOUNTER — Other Ambulatory Visit: Payer: Self-pay | Admitting: Family Medicine

## 2016-10-17 DIAGNOSIS — I10 Essential (primary) hypertension: Secondary | ICD-10-CM

## 2017-02-18 NOTE — Progress Notes (Addendum)
[facility] at [facility] [address] [place], [place] [address] [phone]  Date:  02/19/2017   Name:  Ricky Zuniga   DOB:  1981-02-11   MRN:  [id number]  PCP:  Pearline Cables, MD    Chief Complaint: Follow-up (Pt here to discuss side effects of blood pressure medication. Flu vaccine given today. )   History of Present Illness:  Ricky Zuniga is a 36 y.o. very pleasant male patient who presents with the following:  I last saw him in March of this year:  Here today to establish care.   [facility] but would like to start seeing this practice  He has had HTN for a year or so.  He is still taking norvasc 5mg  and has not noted any SE from this medication.   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   No pain, discharge or lumps or bumps He would like to check his T level- we will order this for him to do later since it is currently afternoon  He does notice some shoulder pain- most often the right shoulder but sometimes the left will also bother him. He has noted this for 3-4 months, will come and go. He does have nighttime pain and cannot lay on his shoulder when it hurts him.  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  Notes that at 36 yo he weighed perhaps 260 lbs  Admits that he has kept many of the  old eating habits he had from his younger, football years and is coming to realize that he cannot eat like that any longer He has not really tried to lose weight yet but is willing to do so   No CP  Wt Readings from Last 3 Encounters:  02/19/17 (!) 330 lb 3.2 oz (149.8 kg)  08/01/15 (!) 317 lb 6.4 oz (144 kg)  06/01/14 (!) 304 lb (137.9 kg)    Patient Active Problem List   Diagnosis Date Noted  . Eczema 05/23/2014  . Tobacco use disorder 05/23/2014  . Constipation 05/19/2014  . HTN (hypertension) 05/19/2014  . Rectal pain 05/21/2013  . Elevated blood pressure 05/21/2013    Past Medical History:  Diagnosis Date  . Hypertension   . Pre-diabetes     Past Surgical History:  Procedure Laterality Date  . NO PAST SURGERIES      Social History  Substance Use Topics  . Smoking status: Former Smoker    Years: 15.00    Types: Cigarettes    Quit date: 07/30/2015  . Smokeless tobacco: Never Used     Comment: Cut down to 3-4 cigarettes/day  . Alcohol use No    Family History  Problem Relation Age of  Onset  . Hypertension Mother   . Diabetes Father   . Stroke Father   . Hypertension Sister   . Asthma Son     No Known Allergies  Medication list has been reviewed and updated.  Current Outpatient Prescriptions on File Prior to Visit  Medication Sig Dispense Refill  . amLODipine (NORVASC) 10 MG tablet TAKE 1 TABLET EVERY DAY 90 tablet 1  . aspirin-acetaminophen-caffeine (EXCEDRIN MIGRAINE) 250-250-65 MG per tablet Take 2 tablets by mouth every 6 (six) hours as needed for pain.    . mupirocin nasal ointment (BACTROBAN) 2 % Apply in each nostril twice daily 10 g 1  . polyethylene glycol powder (GLYCOLAX/MIRALAX) powder Take 17 g by mouth 2 (two) times daily as needed. 3350 g 1  . terbinafine (LAMISIL) 1 % cream Apply 1 application topically 2 (two) times daily. 42 g 1  . triamcinolone cream (KENALOG) 0.1 % Apply 1 application topically 2 (two) times daily. Use as needed for  eczema 80 g 3   No current facility-administered medications on file prior to visit.     Review of Systems:  As per HPI- otherwise negative. No fever or chills No CP or SOB No rash   Physical Examination: Vitals:   02/19/17 1505  BP: 118/82  Pulse: 88  Temp: 98.6 F (37 C)  SpO2: 100%   Vitals:   02/19/17 1505  Weight: (!) 330 lb 3.2 oz (149.8 kg)   Body mass index is 51.72 kg/m. Ideal Body Weight:    GEN: WDWN, NAD, Non-toxic, A & O x 3, obese, otherwise looks well HEENT: Atraumatic, Normocephalic. Neck supple. No masses, No LAD.  Bilateral TM wnl, oropharynx normal.  PEERL,EOMI.   Ears and Nose: No external deformity. CV: RRR, No M/G/R. No JVD. No thrill. No extra heart sounds. PULM: CTA B, no wheezes, crackles, rhonchi. No retractions. No resp. distress. No accessory muscle use. ABD: S, NT, ND, +BS. No rebound. No HSM. EXTR: No c/c/e NEURO Normal gait.  PSYCH: Normally interactive. Conversant. Not depressed or anxious appearing.  Calm demeanor.  Right shoulder: full flexion but some discomfort past 120, normal abduction.  He has some discomfort with impingement manuvers and with internal and external rotation Normal strength, empty can test negative Left shoulder is mostly normal.  He has mild discomfort with internal rotation but normal ROM   Assessment and Plan: Essential hypertension  Chronic pain of both shoulders - Plan: Ambulatory referral to Orthopedic Surgery  Screening for deficiency anemia - Plan: CBC  Screening for diabetes mellitus - Plan: Comprehensive metabolic panel, Hemoglobin A1c  Screening for hyperlipidemia - Plan: Lipid panel  Morbid obesity (HCC)  Erectile dysfunction, unspecified erectile dysfunction type - Plan: Testosterone Total,Free,Bio, Males  Here today for a follow-up visit BP is well controlled, continue current medication Referral to ortho for his shoulders- he likely has some wear and tear changes from football and some  tendonitis as well.   Ordered future labs for him including a T level Discussed weight loss and strategies to help him lose- he plans to work on this  Will plan further follow- up pending labs.   Signed Abbe Amsterdam, MD  Received labs 10/23- called pt.  Left detailed message.  Current A1c does give diagnosis of DM.  Will start on metformin 500 daily, increase to BID after 2 weeks T is low, but will need to confirm with a repeat level.  Weight loss may help to correct this as well.  Will order T  level to do in one month, morning labs.   Otherwise will send him a letter with results and further details.   Results for orders placed or performed in visit on 02/23/17  CBC  Result Value Ref Range   WBC 7.1 4.0 - 10.5 K/uL   RBC 4.62 4.22 - 5.81 Mil/uL   Platelets 246.0 150.0 - 400.0 K/uL   Hemoglobin 14.2 13.0 - 17.0 g/dL   HCT 40.942.6 81.139.0 - 91.452.0 %   MCV 92.2 78.0 - 100.0 fl   MCHC 33.3 30.0 - 36.0 g/dL   RDW 78.215.2 95.611.5 - 21.315.5 %  Comprehensive metabolic panel  Result Value Ref Range   Sodium 140 135 - 145 mEq/L   Potassium 4.2 3.5 - 5.1 mEq/L   Chloride 106 96 - 112 mEq/L   CO2 27 19 - 32 mEq/L   Glucose, Bld 130 (H) 70 - 99 mg/dL   BUN 13 6 - 23 mg/dL   Creatinine, Ser 0.860.92 0.40 - 1.50 mg/dL   Total Bilirubin 0.5 0.2 - 1.2 mg/dL   Alkaline Phosphatase 56 39 - 117 U/L   AST 15 0 - 37 U/L   ALT 19 0 - 53 U/L   Total Protein 6.9 6.0 - 8.3 g/dL   Albumin 4.0 3.5 - 5.2 g/dL   Calcium 9.5 8.4 - 57.810.5 mg/dL   GFR 469.62119.13 >95.28>60.00 mL/min  Lipid panel  Result Value Ref Range   Cholesterol 112 0 - 200 mg/dL   Triglycerides 41.338.0 0.0 - 149.0 mg/dL   HDL 24.4040.80 >10.27>39.00 mg/dL   VLDL 7.6 0.0 - 25.340.0 mg/dL   LDL Cholesterol 64 0 - 99 mg/dL   Total CHOL/HDL Ratio 3    NonHDL 71.36   Hemoglobin A1c  Result Value Ref Range   Hgb A1c MFr Bld 7.2 (H) 4.6 - 6.5 %  Testosterone Total,Free,Bio, Males  Result Value Ref Range   Testosterone 201 (L) 250 - 827 ng/dL   Albumin 4.1 3.6 - 5.1 g/dL    Sex Hormone Binding 27 10 - 50 nmol/L   Testosterone, Free See below 46.0 - 224.0 pg/mL   Testosterone, Bioavailable  110.0 - 575 ng/dL

## 2017-02-19 ENCOUNTER — Ambulatory Visit (INDEPENDENT_AMBULATORY_CARE_PROVIDER_SITE_OTHER): Payer: BC Managed Care – PPO | Admitting: Family Medicine

## 2017-02-19 VITALS — BP 118/82 | HR 88 | Temp 98.6°F | Wt 330.2 lb

## 2017-02-19 DIAGNOSIS — M25512 Pain in left shoulder: Secondary | ICD-10-CM

## 2017-02-19 DIAGNOSIS — Z130 Encounter for screening for diseases of the blood and blood-forming organs and certain disorders involving the immune mechanism: Secondary | ICD-10-CM

## 2017-02-19 DIAGNOSIS — Z131 Encounter for screening for diabetes mellitus: Secondary | ICD-10-CM | POA: Diagnosis not present

## 2017-02-19 DIAGNOSIS — I10 Essential (primary) hypertension: Secondary | ICD-10-CM

## 2017-02-19 DIAGNOSIS — M25511 Pain in right shoulder: Secondary | ICD-10-CM

## 2017-02-19 DIAGNOSIS — E119 Type 2 diabetes mellitus without complications: Secondary | ICD-10-CM

## 2017-02-19 DIAGNOSIS — N529 Male erectile dysfunction, unspecified: Secondary | ICD-10-CM | POA: Diagnosis not present

## 2017-02-19 DIAGNOSIS — Z23 Encounter for immunization: Secondary | ICD-10-CM

## 2017-02-19 DIAGNOSIS — E291 Testicular hypofunction: Secondary | ICD-10-CM | POA: Diagnosis not present

## 2017-02-19 DIAGNOSIS — G8929 Other chronic pain: Secondary | ICD-10-CM | POA: Diagnosis not present

## 2017-02-19 DIAGNOSIS — Z13220 Encounter for screening for lipoid disorders: Secondary | ICD-10-CM

## 2017-02-19 MED ORDER — AMLODIPINE BESYLATE 10 MG PO TABS: 10.0000 mg | ORAL_TABLET | Freq: Every day | ORAL | 2 refills | Status: DC

## 2017-02-19 NOTE — Patient Instructions (Signed)
It was great to see you again today- take care and continue the same dose of your BP medication I am going to refer you to see an orthopedist about your shoulder pain  You have labs ordered for you- please come in at your convenience in the morning (before 9am) to have these drawn. Please come fasting for your cholesterol panel if you can  I would encourage you to work on weight loss- slow and steady is the way to go. Aim to lose 1lb a week.  You might try using a free app like "my fitness pal" or "lose it!" to help you  I'll be in touch with your labs- take care!

## 2017-02-23 ENCOUNTER — Other Ambulatory Visit (INDEPENDENT_AMBULATORY_CARE_PROVIDER_SITE_OTHER): Payer: BC Managed Care – PPO

## 2017-02-23 DIAGNOSIS — Z13220 Encounter for screening for lipoid disorders: Secondary | ICD-10-CM

## 2017-02-23 DIAGNOSIS — Z131 Encounter for screening for diabetes mellitus: Secondary | ICD-10-CM

## 2017-02-23 DIAGNOSIS — Z130 Encounter for screening for diseases of the blood and blood-forming organs and certain disorders involving the immune mechanism: Secondary | ICD-10-CM | POA: Diagnosis not present

## 2017-02-23 DIAGNOSIS — N529 Male erectile dysfunction, unspecified: Secondary | ICD-10-CM

## 2017-02-23 LAB — COMPREHENSIVE METABOLIC PANEL
ALT: 19 U/L (ref 0–53)
AST: 15 U/L (ref 0–37)
Albumin: 4 g/dL (ref 3.5–5.2)
Alkaline Phosphatase: 56 U/L (ref 39–117)
BILIRUBIN TOTAL: 0.5 mg/dL (ref 0.2–1.2)
BUN: 13 mg/dL (ref 6–23)
CALCIUM: 9.5 mg/dL (ref 8.4–10.5)
CO2: 27 meq/L (ref 19–32)
Chloride: 106 mEq/L (ref 96–112)
Creatinine, Ser: 0.92 mg/dL (ref 0.40–1.50)
GFR: 119.13 mL/min (ref >60.00)
Glucose, Bld: 130 mg/dL — ABNORMAL HIGH (ref 70–99)
Potassium: 4.2 mEq/L (ref 3.5–5.1)
Sodium: 140 mEq/L (ref 135–145)
Total Protein: 6.9 g/dL (ref 6.0–8.3)

## 2017-02-23 LAB — LIPID PANEL
CHOL/HDL RATIO: 3
Cholesterol: 112 mg/dL (ref 0–200)
HDL: 40.8 mg/dL (ref >39.00)
LDL Cholesterol: 64 mg/dL (ref 0–99)
NonHDL: 71.36
TRIGLYCERIDES: 38 mg/dL (ref 0.0–149.0)
VLDL: 7.6 mg/dL (ref 0.0–40.0)

## 2017-02-23 LAB — HEMOGLOBIN A1C: Hgb A1c MFr Bld: 7.2 % — ABNORMAL HIGH (ref 4.6–6.5)

## 2017-02-23 LAB — CBC
HCT: 42.6 % (ref 39.0–52.0)
Hemoglobin: 14.2 g/dL (ref 13.0–17.0)
MCHC: 33.3 g/dL (ref 30.0–36.0)
MCV: 92.2 fl (ref 78.0–100.0)
PLATELETS: 246 10*3/uL (ref 150.0–400.0)
RBC: 4.62 Mil/uL (ref 4.22–5.81)
RDW: 15.2 % (ref 11.5–15.5)
WBC: 7.1 10*3/uL (ref 4.0–10.5)

## 2017-02-24 DIAGNOSIS — E291 Testicular hypofunction: Secondary | ICD-10-CM | POA: Insufficient documentation

## 2017-02-24 DIAGNOSIS — E1165 Type 2 diabetes mellitus with hyperglycemia: Secondary | ICD-10-CM | POA: Insufficient documentation

## 2017-02-24 LAB — TESTOSTERONE TOTAL,FREE,BIO, MALES
Albumin: 4.1 g/dL (ref 3.6–5.1)
Sex Hormone Binding: 27 nmol/L (ref 10–50)
Testosterone: 201 ng/dL — ABNORMAL LOW (ref 250–827)

## 2017-02-24 MED ORDER — METFORMIN HCL 500 MG PO TABS: 500.0000 mg | ORAL_TABLET | Freq: Two times a day (BID) | ORAL | 3 refills | Status: DC

## 2017-02-24 NOTE — Addendum Note (Signed)
Addended by: Abbe AmsterdamOPLAND, JESSICA C on: 02/24/2017 02:06 PM   Modules accepted: Orders

## 2017-05-23 NOTE — Progress Notes (Addendum)
[facility] at [facility] [address], [address] [place], [place] [address] [phone]  Date:  05/25/2017   Name:  Ricky Zuniga   DOB:  1981/01/09   MRN:  [id number]  PCP:  Pearline Cables, MD    Chief Complaint: Follow-up (Pt would like STD screening. Denies any sx's at this time. )   History of Present Illness:  Ricky Zuniga is a 37 y.o. very pleasant male patient who presents with the following:  History of DM, obesity, HTN Here today seeking STI screening I last saw him in October of last year No known exposure or any apparent sx, just wants a screening   We will also go ahead and repeat his A1c as this is also due  Lab Results  Component Value Date   HGBA1C 7.6 (H) 05/25/2017   BP Readings from Last 3 Encounters:  05/25/17 (!) 132/96  02/19/17 118/82  08/01/15 (!) 146/98   His BP is under reasonable control He needs to have an eye exam- encouraged him to do so   He also needs a RF of his eczema medication for occasional eczema flare up He also was noted to have low T the last time we checked this for him- he would like to follow-up with a repeat level. However we will need to have this done another morning as it is nearly noon today Patient Active Problem List   Diagnosis Date Noted  . Hypogonadism in male 02/24/2017  . Controlled type 2 diabetes mellitus without complication, without long-term current use of insulin (HCC) 02/24/2017  . Morbid obesity (HCC) 02/24/2017  . Eczema 05/23/2014  . Tobacco use disorder 05/23/2014  . Constipation 05/19/2014  . HTN (hypertension) 05/19/2014  . Rectal pain 05/21/2013  . Elevated blood pressure 05/21/2013    Past Medical History:  Diagnosis Date  . Hypertension   . Pre-diabetes     Past Surgical History:  Procedure Laterality Date  . NO PAST SURGERIES      Social History   Tobacco Use  . Smoking status: Former Smoker    Years: 15.00    Types: Cigarettes   Last attempt to quit: 07/30/2015    Years since quitting: 1.8  . Smokeless tobacco: Never Used  . Tobacco comment: Cut down to 3-4 cigarettes/day  Substance Use Topics  . Alcohol use: No    Alcohol/week: 0.0 oz  . Drug use: No    Family History  Problem Relation Age of Onset  . Hypertension Mother   . Diabetes Father   . Stroke Father   . Hypertension Sister   . Asthma Son     No Known Allergies  Medication list has been reviewed and updated.  Current Outpatient Medications on File Prior to Visit  Medication Sig Dispense Refill  . amLODipine (NORVASC) 10 MG tablet Take 1 tablet (10 mg total) by mouth daily. 90 tablet 2  . aspirin-acetaminophen-caffeine (EXCEDRIN MIGRAINE) 250-250-65 MG per tablet Take 2 tablets by mouth every 6 (six) hours as needed for pain.    . metFORMIN (GLUCOPHAGE) 500 MG tablet Take 1 tablet (500 mg total) by mouth 2 (two) times daily with a meal. Take once a day for 2 weeks to start 180 tablet 3  . mupirocin nasal ointment (BACTROBAN) 2 % Apply in each nostril twice daily 10 g 1  . polyethylene glycol powder (GLYCOLAX/MIRALAX) powder Take 17 g by mouth 2 (two) times daily as needed. 3350 g 1  .  terbinafine (LAMISIL) 1 % cream Apply 1 application topically 2 (two) times daily. 42 g 1   No current facility-administered medications on file prior to visit.     Review of Systems:  As per HPI- otherwise negative. No fever or chills No CP or SOB No rash    Physical Examination: Vitals:   05/25/17 1207  BP: (!) 132/96  Pulse: 84  Temp: 98.6 F (37 C)  SpO2: 97%   Vitals:   05/25/17 1207  Weight: (!) 336 lb 12.8 oz (152.8 kg)  Height: 5\' 7"  (1.702 m)   Body mass index is 52.75 kg/m. Ideal Body Weight: Weight in (lb) to have BMI = 25: 159.3  GEN: WDWN, NAD, Non-toxic, A & O x 3, obese, otherwise looks well HEENT: Atraumatic, Normocephalic. Neck supple. No masses, No LAD. Ears and Nose: No external deformity. CV: RRR, No M/G/R. No JVD. No  thrill. No extra heart sounds. PULM: CTA B, no wheezes, crackles, rhonchi. No retractions. No resp. distress. No accessory muscle use. ABD: S, NT, ND, +BS. No rebound. No HSM. EXTR: No c/c/e NEURO Normal gait.  PSYCH: Normally interactive. Conversant. Not depressed or anxious appearing.  Calm demeanor.  Foot exam done today  Assessment and Plan: Routine screening for STI (sexually transmitted infection) - Plan: Hepatitis B surface antibody, Hepatitis B surface antigen, Hepatitis C antibody, HIV antibody, RPR, C. trachomatis/N. gonorrhoeae RNA, CANCELED: GC/Chlamydia Probe Amp([facility])  Eczema - Plan: triamcinolone cream (KENALOG) 0.1 %  Hypogonadism in male - Plan: Testosterone Total,Free,Bio, Males  Controlled type 2 diabetes mellitus without complication, without long-term current use of insulin (HCC) - Plan: Hemoglobin A1c, Microalbumin / creatinine urine ratio  Here today for a follow-up visit STI screening, blood and urine, as above He will come in for an am testosterone level soon Will plan further follow- up pending labs. Encouraged eye exam    Signed Abbe AmsterdamJessica Gisele Pack, MD  Received his labs 1/22-  Gave him a call- LMOM that labs are negative, I will send him a letter with details  Your STI screening labs are all negative.  You are not immune to hepatitis B however and could be immunized against this at your convenience if you wish.  Your A1c has gone up a bit since our last visit.  I would like to see you work on getting more exercise and losing a few pounds to help bring this number down.  Also, your urine did show a small amount of protein.  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  I would like to change your amlodipine to a different medication (lisinopril) which is more benificial for your kidney health.  Please let me know if this would be ok with you, and otherwise let's plan to visit in about 3 months.     Results for orders placed or performed in  visit on 05/25/17  C. trachomatis/N. gonorrhoeae RNA  Result Value Ref Range   C. trachomatis RNA, TMA NOT DETECTED NOT DETECT   N. gonorrhoeae RNA, TMA NOT DETECTED NOT DETECT  Hepatitis B surface antibody  Result Value Ref Range   Hepatitis B-Post <5 (L) > OR = 10 mIU/mL  Hepatitis B surface antigen  Result Value Ref Range   Hepatitis B Surface Ag NON-REACTIVE NON-REACTI  Hepatitis C antibody  Result Value Ref Range   Hepatitis C Ab NON-REACTIVE NON-REACTI   SIGNAL TO CUT-OFF 0.01 <1.00  HIV antibody  Result Value Ref Range   HIV 1&2  Ab, 4th Generation NON-REACTIVE NON-REACTI  RPR  Result Value Ref Range   RPR Ser Ql NON-REACTIVE NON-REACTI  Hemoglobin A1c  Result Value Ref Range   Hgb A1c MFr Bld 7.6 (H) 4.6 - 6.5 %  Microalbumin / creatinine urine ratio  Result Value Ref Range   Microalb, Ur 2.9 (H) 0.0 - 1.9 mg/dL   Creatinine,U 811.9 mg/dL   Microalb Creat Ratio 1.4 0.0 - 30.0 mg/g

## 2017-05-25 ENCOUNTER — Ambulatory Visit: Payer: BC Managed Care – PPO | Admitting: Family Medicine

## 2017-05-25 VITALS — BP 132/96 | HR 84 | Temp 98.6°F | Ht 67.0 in | Wt 336.8 lb

## 2017-05-25 DIAGNOSIS — E291 Testicular hypofunction: Secondary | ICD-10-CM | POA: Diagnosis not present

## 2017-05-25 DIAGNOSIS — Z113 Encounter for screening for infections with a predominantly sexual mode of transmission: Secondary | ICD-10-CM | POA: Diagnosis not present

## 2017-05-25 DIAGNOSIS — L309 Dermatitis, unspecified: Secondary | ICD-10-CM | POA: Diagnosis not present

## 2017-05-25 DIAGNOSIS — E119 Type 2 diabetes mellitus without complications: Secondary | ICD-10-CM | POA: Diagnosis not present

## 2017-05-25 LAB — HEMOGLOBIN A1C: Hgb A1c MFr Bld: 7.6 % — ABNORMAL HIGH (ref 4.6–6.5)

## 2017-05-25 LAB — MICROALBUMIN / CREATININE URINE RATIO
Creatinine,U: 207.1 mg/dL
Microalb Creat Ratio: 1.4 mg/g (ref 0.0–30.0)
Microalb, Ur: 2.9 mg/dL — ABNORMAL HIGH (ref 0.0–1.9)

## 2017-05-25 MED ORDER — TRIAMCINOLONE ACETONIDE 0.1 % EX CREA: 1.0000 "application " | TOPICAL_CREAM | Freq: Two times a day (BID) | CUTANEOUS | 3 refills | Status: DC

## 2017-05-25 MED FILL — TRIAMCINOLONE 0.1% CREAM: 0.1 | 30 days supply | Qty: 80 | Fill #0

## 2017-05-25 MED FILL — metFORMIN HCL 500 MG TABS: 500 | 90 days supply | Qty: 180 | Fill #0

## 2017-05-25 NOTE — Patient Instructions (Addendum)
I will be in touch with your labs asap Please come in for a repeat testosterone level in the morning at your convenience you would want to have this lab drawn by 8:30 am at the latest   Also please get an eye exam if you have not done so already- this should be done annually for diabetes

## 2017-05-26 ENCOUNTER — Encounter: Payer: Self-pay | Admitting: Family Medicine

## 2017-05-26 LAB — C. TRACHOMATIS/N. GONORRHOEAE RNA
C. TRACHOMATIS RNA, TMA: NOT DETECTED
N. gonorrhoeae RNA, TMA: NOT DETECTED

## 2017-05-26 LAB — HEPATITIS B SURFACE ANTIGEN: HEP B S AG: NONREACTIVE

## 2017-05-26 LAB — HEPATITIS B SURFACE ANTIBODY, QUANTITATIVE: Hepatitis B-Post: <5 m[IU]/mL — ABNORMAL LOW (ref >=10)

## 2017-05-26 LAB — HEPATITIS C ANTIBODY
Hepatitis C Ab: NONREACTIVE
SIGNAL TO CUT-OFF: 0.01 (ref <1.00)

## 2017-05-26 LAB — RPR: RPR: NONREACTIVE

## 2017-05-26 LAB — HIV ANTIBODY (ROUTINE TESTING W REFLEX): HIV 1&2 Ab, 4th Generation: NONREACTIVE

## 2017-07-18 ENCOUNTER — Other Ambulatory Visit: Payer: Self-pay | Admitting: Family Medicine

## 2017-07-18 DIAGNOSIS — I10 Essential (primary) hypertension: Secondary | ICD-10-CM

## 2018-02-01 ENCOUNTER — Other Ambulatory Visit: Payer: Self-pay | Admitting: Family Medicine

## 2018-02-01 DIAGNOSIS — I10 Essential (primary) hypertension: Secondary | ICD-10-CM

## 2018-03-30 ENCOUNTER — Emergency Department (HOSPITAL_COMMUNITY)
Admission: EM | Admit: 2018-03-30 | Discharge: 2018-03-30 | Disposition: A | Discharge status: Home / Self Care | Payer: BC Managed Care – PPO | Attending: Emergency Medicine | Admitting: Emergency Medicine

## 2018-03-30 ENCOUNTER — Ambulatory Visit: Payer: BC Managed Care – PPO | Admitting: Family Medicine

## 2018-03-30 ENCOUNTER — Emergency Department (HOSPITAL_COMMUNITY): Payer: BC Managed Care – PPO

## 2018-03-30 ENCOUNTER — Encounter: Payer: Self-pay | Admitting: Gastroenterology

## 2018-03-30 ENCOUNTER — Telehealth: Payer: Self-pay | Admitting: *Deleted

## 2018-03-30 ENCOUNTER — Encounter (HOSPITAL_COMMUNITY): Payer: Self-pay | Admitting: Emergency Medicine

## 2018-03-30 ENCOUNTER — Encounter: Payer: Self-pay | Admitting: Family Medicine

## 2018-03-30 ENCOUNTER — Telehealth: Payer: Self-pay

## 2018-03-30 VITALS — BP 140/98 | HR 82 | Temp 98.1°F | Ht 67.0 in | Wt 322.2 lb

## 2018-03-30 DIAGNOSIS — R350 Frequency of micturition: Secondary | ICD-10-CM

## 2018-03-30 DIAGNOSIS — R05 Cough: Secondary | ICD-10-CM

## 2018-03-30 DIAGNOSIS — R739 Hyperglycemia, unspecified: Secondary | ICD-10-CM | POA: Insufficient documentation

## 2018-03-30 DIAGNOSIS — R059 Cough, unspecified: Secondary | ICD-10-CM

## 2018-03-30 DIAGNOSIS — E1165 Type 2 diabetes mellitus with hyperglycemia: Secondary | ICD-10-CM | POA: Diagnosis not present

## 2018-03-30 DIAGNOSIS — R1310 Dysphagia, unspecified: Secondary | ICD-10-CM | POA: Insufficient documentation

## 2018-03-30 DIAGNOSIS — R1319 Other dysphagia: Secondary | ICD-10-CM

## 2018-03-30 DIAGNOSIS — Z87891 Personal history of nicotine dependence: Secondary | ICD-10-CM | POA: Diagnosis not present

## 2018-03-30 LAB — CBC
HCT: 45 % (ref 39.0–52.0)
Hemoglobin: 14.7 g/dL (ref 13.0–17.0)
MCH: 29.1 pg (ref 26.0–34.0)
MCHC: 32.7 g/dL (ref 30.0–36.0)
MCV: 89.1 fL (ref 80.0–100.0)
PLATELETS: 251 10*3/uL (ref 150–400)
RBC: 5.05 MIL/uL (ref 4.22–5.81)
RDW: 14.3 % (ref 11.5–15.5)
WBC: 10.2 10*3/uL (ref 4.0–10.5)
nRBC: 0 % (ref 0.0–0.2)

## 2018-03-30 LAB — COMPREHENSIVE METABOLIC PANEL
ALT: 23 U/L (ref 0–53)
ALT: 29 U/L (ref 0–44)
AST: 13 U/L (ref 0–37)
AST: 19 U/L (ref 15–41)
Albumin: 4.4 g/dL (ref 3.5–5.2)
Albumin: 4 g/dL (ref 3.5–5.0)
Alkaline Phosphatase: 89 U/L (ref 39–117)
Alkaline Phosphatase: 88 U/L (ref 38–126)
Anion gap: 11 (ref 5–15)
BILIRUBIN TOTAL: 0.6 mg/dL (ref 0.2–1.2)
BUN: 13 mg/dL (ref 6–23)
BUN: 13 mg/dL (ref 6–20)
CHLORIDE: 102 mmol/L (ref 98–111)
CO2: 24 mEq/L (ref 19–32)
CO2: 21 mmol/L — ABNORMAL LOW (ref 22–32)
CREATININE: 1.01 mg/dL (ref 0.40–1.50)
CREATININE: 1.21 mg/dL (ref 0.61–1.24)
Calcium: 9.7 mg/dL (ref 8.4–10.5)
Calcium: 9.6 mg/dL (ref 8.9–10.3)
Chloride: 97 mEq/L (ref 96–112)
GFR: 106.33 mL/min (ref >60.00)
GLUCOSE: 546 mg/dL — AB (ref 70–99)
Glucose, Bld: 487 mg/dL — ABNORMAL HIGH (ref 70–99)
POTASSIUM: 4.6 mmol/L (ref 3.5–5.1)
Potassium: 4.5 mEq/L (ref 3.5–5.1)
Sodium: 132 mEq/L — ABNORMAL LOW (ref 135–145)
Sodium: 134 mmol/L — ABNORMAL LOW (ref 135–145)
Total Bilirubin: 0.9 mg/dL (ref 0.3–1.2)
Total Protein: 7.3 g/dL (ref 6.0–8.3)
Total Protein: 7.3 g/dL (ref 6.5–8.1)

## 2018-03-30 LAB — URINALYSIS, ROUTINE W REFLEX MICROSCOPIC
Bacteria, UA: NONE SEEN
Bilirubin Urine: NEGATIVE
Glucose, UA: >=500 mg/dL — AB
Hgb urine dipstick: NEGATIVE
Ketones, ur: 80 mg/dL — AB
Leukocytes, UA: NEGATIVE
Nitrite: NEGATIVE
Protein, ur: NEGATIVE mg/dL
SPECIFIC GRAVITY, URINE: 1.031 — AB (ref 1.005–1.030)
pH: 5 (ref 5.0–8.0)

## 2018-03-30 LAB — POC URINALSYSI DIPSTICK (AUTOMATED)
BILIRUBIN UA: NEGATIVE
Blood, UA: NEGATIVE
GLUCOSE UA: POSITIVE — AB
LEUKOCYTES UA: NEGATIVE
NITRITE UA: NEGATIVE
PH UA: 6 (ref 5.0–8.0)
Protein, UA: NEGATIVE
Spec Grav, UA: 1.01 (ref 1.010–1.025)
Urobilinogen, UA: 0.2 E.U./dL

## 2018-03-30 LAB — HEMOGLOBIN A1C: HEMOGLOBIN A1C: 11.6 % — AB (ref 4.6–6.5)

## 2018-03-30 LAB — CBG MONITORING, ED: GLUCOSE-CAPILLARY: 416 mg/dL — AB (ref 70–99)

## 2018-03-30 MED ORDER — PANTOPRAZOLE SODIUM 40 MG PO TBEC: 40.0000 mg | DELAYED_RELEASE_TABLET | Freq: Every day | ORAL | 1 refills | Status: DC

## 2018-03-30 MED ORDER — METFORMIN HCL 500 MG PO TABS: ORAL_TABLET | ORAL | 1 refills | Status: DC

## 2018-03-30 MED ORDER — INSULIN ASPART 100 UNIT/ML ~~LOC~~ SOLN
2.0000 [IU] | Freq: Once | SUBCUTANEOUS | Status: AC
  Administered 2018-03-30: 2 [IU] via INTRAVENOUS

## 2018-03-30 NOTE — Discharge Instructions (Signed)
You were evaluated in the Emergency Department and after careful evaluation, we did not find any emergent condition requiring admission or further testing in the hospital.  Your symptoms today seem to be due to high blood sugar.  It is important to take your metformin medication as directed by your primary care provider.  Your labs, chest x-ray, and urinalysis today were reassuring.  Please follow-up with your primary care provider as soon as you are able to discuss your blood sugar medication regimen.  Please return to the Emergency Department if you experience any worsening of your condition.  We encourage you to follow up with a primary care provider.  Thank you for allowing us to be a part of your care.

## 2018-03-30 NOTE — Patient Instructions (Addendum)
Give us 2-3 business days to get the results of your labs back.   Check your sugars around 2-3 times per week. Alternate checking in the morning before you eat, in the afternoon and before bed. Write them down and bring it to your next appointment.   The only lifestyle changes that have data behind them are weight loss for the overweight/obese and elevating the head of the bed. Finding out which foods/positions are triggers is important.  If you do not hear anything about your referral in the next 1-2 weeks, call our office and ask for an update.  Keep the diet clean and stay active.  Let us know if you need anything.

## 2018-03-30 NOTE — ED Triage Notes (Signed)
Pt arrives from PCP office with reports that his glucose was 584. Reports polyuria, polydipsia.

## 2018-03-30 NOTE — Telephone Encounter (Signed)
Pt was sent to the ER

## 2018-03-30 NOTE — Telephone Encounter (Signed)
Called patient and advised of critical glucose value at 546. Pt. Was told per Dr. .Laury AxonLowne he needs to go to the emergency room today. Patient verbalized understanding and said he will go to the er "now".

## 2018-03-30 NOTE — Progress Notes (Signed)
Chief Complaint  Patient presents with  . Urinary Frequency  . Gastroesophageal Reflux  . Fatigue    Subjective: Patient is a 37 y.o. male here for freq urination.  Over the last 2 weeks, the patient has been having increased urination, increased thirst/dry mouth, and he thinks he may be losing weight, but does not know his weight from 1 month ago.  He does have a history of diabetes.  He ran out of his metformin 1 month ago and has not had a refill since then.  His diet is fair overall and he tries to stay active.  He believes his vision is affected also being more blurry.  It has been over 1 year since his last diabetic eye exam.  He is requesting referral.  The patient has not been checking his sugars.  No blood in the urine or pain with urination.  Feels like his food is getting stuck in his chest over past couple weeks.  He does not regurgitate.  He does have associated reflux and is wondering if this is related.  He got a generic brand over-the-counter that up a little bit with his reflux, but not getting stuck.  It does not seem to matter whether it is solid or liquid.  ROS:  GU: Increased frequency GI: + Dysphagia  Past Medical History:  Diagnosis Date  . Hypertension   . Pre-diabetes     Objective: BP (!) 140/98 (BP Location: Left Arm, Patient Position: Sitting, Cuff Size: Large)   Pulse 82   Temp 98.1 F (36.7 C) (Oral)   Ht 5\' 7"  (1.702 m)   Wt (!) 322 lb 4 oz (146.2 kg)   SpO2 96%   BMI 50.47 kg/m  General: Awake, appears stated age HEENT: MMM, EOMi Heart: RRR, no lower extremity edema Lungs: CTAB, no rales, wheezes or rhonchi. No accessory muscle use GI: Bowel sounds present, soft, mildly distended, nontender, no masses or organomegaly MSK: No CVA tenderness Psych: Age appropriate judgment and insight, normal affect and mood  Assessment and Plan: Frequent urination - Plan: POCT Urinalysis Dipstick (Automated), Comprehensive metabolic panel  Type 2 diabetes  mellitus with hyperglycemia, without long-term current use of insulin (HCC) - Plan: Hemoglobin A1c, Comprehensive metabolic panel, metFORMIN (GLUCOPHAGE) 500 MG tablet, Ambulatory referral to Ophthalmology  Esophageal dysphagia - Plan: Ambulatory referral to Gastroenterology, pantoprazole (PROTONIX) 40 MG tablet  UA shows both ketones and glucose.  Will check A1c and restart metformin.  I did encourage him to check his sugars at home.  Counseled on diet and exercise. We will try a PPI for #3.  I do think he needs us to gastroneurology for possible scope and dilation. Follow-up with regular PCP in 6 weeks. The patient voiced understanding and agreement to the plan.  Jilda Rocheicholas Paul OpheimWendling, DO 03/30/18  10:59 AM

## 2018-03-30 NOTE — Progress Notes (Signed)
Pre visit review using our clinic review tool, if applicable. No additional management support is needed unless otherwise documented below in the visit note. 

## 2018-03-30 NOTE — Telephone Encounter (Signed)
CRITICAL VALUE STICKER  CRITICAL VALUE: glucose 546  RECEIVER (on-site recipient of call): Ricky Zuniga, CMA  DATE & TIME NOTIFIED: 03/30/18 / 5:05pm  MESSENGER (representative from lab): Sy  MD NOTIFIED: Dr Zola ButtonLowne Chase (Doc of Day) / Sima Matasuth Sierra, CMA  TIME OF NOTIFICATION: 5:08pm  RESPONSE:

## 2018-03-30 NOTE — ED Provider Notes (Signed)
[facility] Emergency Department Provider Note MRN:  [id number]  Arrival date & time: 03/30/18     Chief Complaint   Hyperglycemia   History of Present Illness   Ricky Zuniga is a 37 y.o. year-old male with a history of hypertension, prediabetes presenting to the ED with chief complaint of hyperglycemia.  Patient has been experiencing 1 week of polyuria, polydipsia.  Intermittent blurry vision.  Cough persistent for several weeks, dry.  No headache, no shortness of breath.  No chest pain, no abdominal pain, no dysuria.  Sent here from PCP office for glucose greater than 500.  Patient was recently started on metformin, but ran out a few days ago.  Symptoms are constant, mild in severity, no exacerbating or alleviating factors.  Review of Systems  A complete 10 system review of systems was obtained and all systems are negative except as noted in the HPI and PMH.   Patient's Health History    Past Medical History:  Diagnosis Date  . Hypertension   . Pre-diabetes     Past Surgical History:  Procedure Laterality Date  . NO PAST SURGERIES      Family History  Problem Relation Age of Onset  . Hypertension Mother   . Diabetes Father   . Stroke Father   . Hypertension Sister   . Asthma Son     Social History   Socioeconomic History  . Marital status: Married    Spouse name: Felicie MornY'kita  . Number of children: 4  . Years of education: college  . Highest education level: Not on file  Occupational History    Employer: [employer]  Social Needs  . Financial resource strain: Not on file  . Food insecurity:    Worry: Not on file    Inability: Not on file  . Transportation needs:    Medical: Not on file    Non-medical: Not on file  Tobacco Use  . Smoking status: Former Smoker    Years: 15.00    Types: Cigarettes    Last attempt to quit: 07/30/2015    Years since quitting: 2.6  . Smokeless tobacco: Never Used  . Tobacco comment: Cut down to 3-4  cigarettes/day  Substance and Sexual Activity  . Alcohol use: No    Alcohol/week: 0.0 standard drinks  . Drug use: No  . Sexual activity: Not on file  Lifestyle  . Physical activity:    Days per week: Not on file    Minutes per session: Not on file  . Stress: Not on file  Relationships  . Social connections:    Talks on phone: Not on file    Gets together: Not on file    Attends religious service: Not on file    Active member of club or organization: Not on file    Attends meetings of clubs or organizations: Not on file    Relationship status: Not on file  . Intimate partner violence:    Fear of current or ex partner: Not on file    Emotionally abused: Not on file    Physically abused: Not on file    Forced sexual activity: Not on file  Other Topics Concern  . Not on file  Social History Narrative   Patient lives at home with family.   Caffeine Use: 1 cup daily     Physical Exam  Vital Signs and Nursing Notes reviewed Vitals:   03/30/18 1833  BP: (!) 130/97  Pulse: 90  Resp: 16  Temp: 98.7 F (37.1 C)  SpO2: 100%    CONSTITUTIONAL:  -appearing, NAD NEURO:  Alert and oriented x 3, no focal deficits EYES:  eyes equal and reactive ENT/NECK:  no LAD, no JVD CARDIO:  regular rate, well-perfused, normal S1 and S2 PULM:  CTAB no wheezing or rhonchi GI/GU:  normal bowel sounds, non-distended, non-tender MSK/SPINE:  No gross deformities, no edema SKIN:  no rash, atraumatic PSYCH:  Appropriate speech and behavior  Diagnostic and Interventional Summary    Labs Reviewed  COMPREHENSIVE METABOLIC PANEL - Abnormal; Notable for the following components:      Result Value   Sodium 134 (*)    CO2 21 (*)    Glucose, Bld 487 (*)    All other components within normal limits  URINALYSIS, ROUTINE W REFLEX MICROSCOPIC - Abnormal; Notable for the following components:   Color, Urine STRAW (*)    Specific Gravity, Urine 1.031 (*)    Glucose, UA >=500 (*)    Ketones, ur 80 (*)     All other components within normal limits  CBG MONITORING, ED - Abnormal; Notable for the following components:   Glucose-Capillary 416 (*)    All other components within normal limits  CBC    DG Chest 2 View  Final Result      Medications  insulin aspart (novoLOG) injection 2 Units (2 Units Intravenous Given 03/30/18 2109)     Procedures Critical Care  ED Course and Medical Decision Making  I have reviewed the triage vital signs and the nursing notes.  Pertinent labs & imaging results that were available during my care of the patient were reviewed by me and considered in my medical decision making (see below for details).  Constellation of symptoms consistent with hyperglycemia and this newly diagnosed diabetic.  Patient has been having increased cough recently, will screen for infection as the trigger for the hyperglycemia.  Seems that it is more related to his recent stopping of the metformin.  Chest x-ray and urinalysis with no evidence to suggest infection.  Labs reveal normal electrolytes, no significant base deficit, normal anion gap.  Glucose near 500, corrected to 400 after 2 units aspart.  We will follow-up with PCP.  Encouraged diet and exercise.  After the discussed management above, the patient was determined to be safe for discharge.  The patient was in agreement with this plan and all questions regarding their care were answered.  ED return precautions were discussed and the patient will return to the ED with any significant worsening of condition.  Elmer Sow. Pilar Plate, MD [facility] Emergency Medicine [facility] [email or web address]  Final Clinical Impressions(s) / ED Diagnoses     ICD-10-CM   1. Hyperglycemia R73.9   2. Cough R05 DG Chest 2 View    DG Chest 2 View    ED Discharge Orders    None         Sabas Sous, MD 03/30/18 2239

## 2018-03-31 LAB — CBG MONITORING, ED: GLUCOSE-CAPILLARY: 481 mg/dL — AB (ref 70–99)

## 2018-04-01 NOTE — Progress Notes (Signed)
Ricky Zuniga at [facility] [address] [place], [place] [address] [phone]  Date:  04/05/2018   Name:  Ricky Zuniga   DOB:  22-Jan-1981   MRN:  [id number]  PCP:  Darreld Mclean, MD    Chief Complaint: ER follow up (hyperglycemia, been feeling uch better-vision has been more clear, does not check glucose at home) and Hemorroids   History of Present Illness:  Ricky Zuniga is a 37 y.o. very pleasant male patient who presents with the following:  Following up on diabetes I had last seen him in January at which time his DM was under reasonable control on metformin He ran out of metformin months ago and stopped taking it  He was more recently seen by Dr. Nani Ravens on 11/26 and found to have a critically high glucose at 546. He was having urinary frequency, blurry vision, fatigue overall feeling bad  He was sent to the ER for stabilization and was started back on metformin -he is slowly increasing his dose and is currently taking 500 mg once a day - will increase to BID tomorrow   Lab Results  Component Value Date   HGBA1C 11.6 (H) 03/30/2018   Flu shot: Eye exam: Pneumovax   He is not checking his glucose at home He still has urinary frequency but he overall feels better than he did   He stopped drinking sugar sodas, and is drinking more water  He does not have a meter at home   He has noted some rectal/ anal pain with defecation and is not sure if he might have a hemorrhoid or fissure May sometimes have a spot of blood on the tissue when he wipes   Wt Readings from Last 3 Encounters:  04/05/18 (!) 317 lb (143.8 kg)  03/30/18 (!) 322 lb 1.5 oz (146.1 kg)  03/30/18 (!) 322 lb 4 oz (146.2 kg)     Patient Active Problem List   Diagnosis Date Noted  . Esophageal dysphagia 03/30/2018  . Hypogonadism in male 02/24/2017  . Type 2 diabetes mellitus with hyperglycemia, without long-term current use of insulin ([place])  02/24/2017  . Morbid obesity ([place]) 02/24/2017  . Eczema 05/23/2014  . Tobacco use disorder 05/23/2014  . Constipation 05/19/2014  . HTN (hypertension) 05/19/2014  . Rectal pain 05/21/2013  . Elevated blood pressure 05/21/2013    Past Medical History:  Diagnosis Date  . Hypertension   . Pre-diabetes     Past Surgical History:  Procedure Laterality Date  . NO PAST SURGERIES      Social History   Tobacco Use  . Smoking status: Former Smoker    Years: 15.00    Types: Cigarettes    Last attempt to quit: 07/30/2015    Years since quitting: 2.6  . Smokeless tobacco: Never Used  . Tobacco comment: Cut down to 3-4 cigarettes/day  Substance Use Topics  . Alcohol use: No    Alcohol/week: 0.0 standard drinks  . Drug use: No    Family History  Problem Relation Age of Onset  . Hypertension Mother   . Diabetes Father   . Stroke Father   . Hypertension Sister   . Asthma Son     No Known Allergies  Medication list has been reviewed and updated.  Current Outpatient Medications on File Prior to Visit  Medication Sig Dispense Refill  . amLODipine (NORVASC) 10 MG tablet Take 1 tablet (10 mg total) by mouth daily. [place]  tablet 1  . aspirin-acetaminophen-caffeine (EXCEDRIN MIGRAINE) 505-183-35 MG per tablet Take 2 tablets by mouth every 6 (six) hours as needed for pain.    . metFORMIN (GLUCOPHAGE) 500 MG tablet Week 1: 1 tab daily Week 2: 1 tab twice daily Week 3: 2 tabs in AM, 1 in evening Week 4: 2 tabs twice daily 120 tablet 1  . pantoprazole (PROTONIX) 40 MG tablet Take 1 tablet (40 mg total) by mouth daily. 30 tablet 1   No current facility-administered medications on file prior to visit.     Review of Systems:  As per HPI- otherwise negative.   Physical Examination: Vitals:   04/05/18 1508  BP: (!) 148/90  Pulse: 95  Resp: 18  SpO2: 97%   Vitals:   04/05/18 1508  Weight: (!) 317 lb (143.8 kg)  Height: _0  (1.702 m)   Body mass index is 49.65 kg/m. Ideal  Body Weight: Weight in (lb) to have BMI = 25: 159.3  GEN: WDWN, NAD, Non-toxic, A & O x 3, obese, looks well  HEENT: Atraumatic, Normocephalic. Neck supple. No masses, No LAD. Ketones on breath Ears and Nose: No external deformity. CV: RRR, No M/G/R. No JVD. No thrill. No extra heart sounds. PULM: CTA B, no wheezes, crackles, rhonchi. No retractions. No resp. distress. No accessory muscle use. ABD: S, NT, ND, +BS. No rebound. No HSM. EXTR: No c/c/e NEURO Normal gait.  PSYCH: Normally interactive. Conversant. Not depressed or anxious appearing.  Calm demeanor.  Rectal: no visible tear or hemorrhoid, but he is tender with DTR at 6:00 c/w a fissure    Results for orders placed or performed in visit on 04/05/18  POCT glucose (manual entry)  Result Value Ref Range   POC Glucose HIGH 70 - 99 mg/dl  POCT Urinalysis Dipstick (Automated)  Result Value Ref Range   Color, UA YELLOW    Clarity, UA CLEAR    Glucose, UA Positive (A) Negative   Bilirubin, UA NEGATIVE    Ketones, UA 3+    Spec Grav, UA 1.015 1.010 - 1.025   Blood, UA NEGATIVE    pH, UA 6.0 5.0 - 8.0   Protein, UA Positive (A) Negative   Urobilinogen, UA negative (A) 0.2 or 1.0 E.U./dL   Nitrite, UA NEGATIVE    Leukocytes, UA Negative Negative    Assessment and Plan: Type 2 diabetes mellitus with hyperglycemia, without long-term current use of insulin (HCC) - Plan: Basic metabolic panel, POCT glucose (manual entry), POCT Urinalysis Dipstick (Automated), blood glucose meter kit and supplies  Here today for a follow-up visit for his newly uncontrolled DM unfortunately his glucose is still out of control- we cannot read his sugar on our meter.  He will go to the ER downstairs to be stabilized   Pt is scheduled to see me on Wednesday for a follow-up visit; plan to start on a once a day insulin pen at that time  Gave him an rx so he can get a meter and strips in the meantime- we can do teaching on Wednesday  Signed Lamar Blinks, MD

## 2018-04-05 ENCOUNTER — Encounter: Payer: Self-pay | Admitting: Family Medicine

## 2018-04-05 ENCOUNTER — Encounter (HOSPITAL_BASED_OUTPATIENT_CLINIC_OR_DEPARTMENT_OTHER): Payer: Self-pay | Admitting: *Deleted

## 2018-04-05 ENCOUNTER — Ambulatory Visit: Payer: BC Managed Care – PPO | Admitting: Family Medicine

## 2018-04-05 ENCOUNTER — Emergency Department (HOSPITAL_BASED_OUTPATIENT_CLINIC_OR_DEPARTMENT_OTHER)
Admission: EM | Admit: 2018-04-05 | Discharge: 2018-04-05 | Disposition: A | Discharge status: Home / Self Care | Payer: BC Managed Care – PPO | Attending: Emergency Medicine | Admitting: Emergency Medicine

## 2018-04-05 ENCOUNTER — Other Ambulatory Visit: Payer: Self-pay

## 2018-04-05 VITALS — BP 148/90 | HR 95 | Resp 18 | Ht 67.0 in | Wt 317.0 lb

## 2018-04-05 DIAGNOSIS — Z7984 Long term (current) use of oral hypoglycemic drugs: Secondary | ICD-10-CM | POA: Diagnosis not present

## 2018-04-05 DIAGNOSIS — R739 Hyperglycemia, unspecified: Secondary | ICD-10-CM

## 2018-04-05 DIAGNOSIS — E1165 Type 2 diabetes mellitus with hyperglycemia: Secondary | ICD-10-CM | POA: Diagnosis present

## 2018-04-05 DIAGNOSIS — I10 Essential (primary) hypertension: Secondary | ICD-10-CM | POA: Diagnosis not present

## 2018-04-05 DIAGNOSIS — E860 Dehydration: Secondary | ICD-10-CM | POA: Diagnosis not present

## 2018-04-05 DIAGNOSIS — Z87891 Personal history of nicotine dependence: Secondary | ICD-10-CM | POA: Diagnosis not present

## 2018-04-05 LAB — BASIC METABOLIC PANEL
ANION GAP: 13 (ref 5–15)
BUN: 15 mg/dL (ref 6–20)
CO2: 17 mmol/L — ABNORMAL LOW (ref 22–32)
Calcium: 9.4 mg/dL (ref 8.9–10.3)
Chloride: 101 mmol/L (ref 98–111)
Creatinine, Ser: 1.1 mg/dL (ref 0.61–1.24)
Glucose, Bld: 443 mg/dL — ABNORMAL HIGH (ref 70–99)
POTASSIUM: 4.4 mmol/L (ref 3.5–5.1)
SODIUM: 131 mmol/L — AB (ref 135–145)

## 2018-04-05 LAB — URINALYSIS, ROUTINE W REFLEX MICROSCOPIC
Bilirubin Urine: NEGATIVE
Glucose, UA: >=500 mg/dL — AB
Ketones, ur: >80 mg/dL — AB
Leukocytes, UA: NEGATIVE
NITRITE: NEGATIVE
PROTEIN: NEGATIVE mg/dL
Specific Gravity, Urine: 1.015 (ref 1.005–1.030)
pH: 5.5 (ref 5.0–8.0)

## 2018-04-05 LAB — CBG MONITORING, ED
GLUCOSE-CAPILLARY: 399 mg/dL — AB (ref 70–99)
GLUCOSE-CAPILLARY: 300 mg/dL — AB (ref 70–99)

## 2018-04-05 LAB — POC URINALSYSI DIPSTICK (AUTOMATED)
Bilirubin, UA: NEGATIVE
Blood, UA: NEGATIVE
Glucose, UA: POSITIVE — AB
LEUKOCYTES UA: NEGATIVE
NITRITE UA: NEGATIVE
PROTEIN UA: POSITIVE — AB
Spec Grav, UA: 1.015 (ref 1.010–1.025)
UROBILINOGEN UA: NEGATIVE U/dL — AB
pH, UA: 6 (ref 5.0–8.0)

## 2018-04-05 LAB — GLUCOSE, POCT (MANUAL RESULT ENTRY): POC Glucose: HIGH mg/dl (ref 70–99)

## 2018-04-05 LAB — CBC
HCT: 46.1 % (ref 39.0–52.0)
HEMOGLOBIN: 14.9 g/dL (ref 13.0–17.0)
MCH: 29 pg (ref 26.0–34.0)
MCHC: 32.3 g/dL (ref 30.0–36.0)
MCV: 89.9 fL (ref 80.0–100.0)
NRBC: 0 % (ref 0.0–0.2)
PLATELETS: 249 10*3/uL (ref 150–400)
RBC: 5.13 MIL/uL (ref 4.22–5.81)
RDW: 14.5 % (ref 11.5–15.5)
WBC: 10.8 10*3/uL — AB (ref 4.0–10.5)

## 2018-04-05 LAB — URINALYSIS, MICROSCOPIC (REFLEX)
Bacteria, UA: NONE SEEN
WBC, UA: NONE SEEN WBC/hpf (ref 0–5)

## 2018-04-05 MED ORDER — BLOOD GLUCOSE METER KIT: PACK | 0 refills | Status: AC

## 2018-04-05 MED ORDER — SODIUM CHLORIDE 0.9 % IV BOLUS
1000.0000 mL | Freq: Once | INTRAVENOUS | Status: AC
  Administered 2018-04-05: 1000 mL via INTRAVENOUS

## 2018-04-05 NOTE — ED Notes (Signed)
Pt came down from upstairs at his PCP office for a blood sugar reading of "high." states they did not give him anything for his sugar, and that he has been compliant with his medications.

## 2018-04-05 NOTE — ED Notes (Signed)
Pt still has half a bag of saline left, given water and diet ginger ale

## 2018-04-05 NOTE — ED Triage Notes (Signed)
He was seen by his MD today for follow up for diabetes control. His CBG was "high". He was told to come here.

## 2018-04-05 NOTE — Patient Instructions (Signed)
Please go to the ER downstairs so they can treat your blood sugar You have an appt to see me on Wednesday. In the meantime please visit your drug store and get a blood sugar meter and strips.  We are going to plan to start you on insulin at least for the short term while we get your blood sugar under control

## 2018-04-05 NOTE — ED Notes (Signed)
Pt verbalizes understanding of d/c instructions and denies any further needs at this time. 

## 2018-04-05 NOTE — Discharge Instructions (Signed)
Please read and follow all provided instructions.  Your diagnoses today include:  1. Hyperglycemia without ketosis     Tests performed today include:  Blood counts and electrolytes - shows high blood sugar, no evidence of complicating factors  Vital signs. See below for your results today.   Medications prescribed:   None  Take any prescribed medications only as directed.  Home care instructions:  Follow any educational materials contained in this packet.  Continue home metformin as instructed by your doctor.  Follow-up instructions: Please follow-up with your primary care provider in 2 days as planned for further evaluation of your symptoms.   Return instructions:   Please return to the Emergency Department if you experience worsening symptoms.   Please return if you have any other emergent concerns.  Additional Information:  Your vital signs today were: BP (!) 143/89 (BP Location: Left Arm)    Pulse 74    Temp 98.7 F (37.1 C) (Oral)    Resp 18    Ht 5\' 7"  (1.702 m)    Wt (!) 143.7 kg    SpO2 99%    BMI 49.62 kg/m  If your blood pressure (BP) was elevated above 135/85 this visit, please have this repeated by your doctor within one month. --------------

## 2018-04-05 NOTE — ED Provider Notes (Signed)
[place] EMERGENCY DEPARTMENT Provider Note   CSN: [id number] Arrival date & time: 04/05/18  1612     History   Chief Complaint Chief Complaint  Patient presents with  . Hyperglycemia    HPI Ricky Zuniga is a 37 y.o. male.  Patient presents from primary care office with elevated blood sugars.  Patient has known type 2 diabetes.  He was recently restarted on metformin.  He is currently in the midst of an upward titration.  He followed up with PCP today and the blood sugar was found to be high.  Patient complains of some generalized abdominal pain, constipation.  No vomiting.  Patient reports having a dry mouth and increased thirst and urination for the past week.  He also has generalized fatigue.  No URI symptoms, fever, chest pain or cough.  No diarrhea or skin rashes.  Patient is going to follow-up with PCP in 2 days.  They are considering putting him on insulin for the short-term.     Past Medical History:  Diagnosis Date  . Hypertension   . Pre-diabetes     Patient Active Problem List   Diagnosis Date Noted  . Esophageal dysphagia 03/30/2018  . Hypogonadism in male 02/24/2017  . Type 2 diabetes mellitus with hyperglycemia, without long-term current use of insulin ([place]) 02/24/2017  . Morbid obesity ([place]) 02/24/2017  . Eczema 05/23/2014  . Tobacco use disorder 05/23/2014  . Constipation 05/19/2014  . HTN (hypertension) 05/19/2014  . Rectal pain 05/21/2013  . Elevated blood pressure 05/21/2013    Past Surgical History:  Procedure Laterality Date  . NO PAST SURGERIES          Home Medications    Prior to Admission medications   Medication Sig Start Date End Date Taking? Authorizing Provider  amLODipine (NORVASC) 10 MG tablet Take 1 tablet (10 mg total) by mouth daily. 02/01/18   Copland, Gay Filler, MD  aspirin-acetaminophen-caffeine (EXCEDRIN MIGRAINE) [id number] MG per tablet Take 2 tablets by mouth every 6 (six) hours as needed for pain.     [provider]  blood glucose meter kit and supplies Dispense based on patient and insurance preference. Use up to four times daily as directed. (FOR ICD-10 E10.9, E11.9). 04/05/18   Copland, Gay Filler, MD  metFORMIN (GLUCOPHAGE) 500 MG tablet Week 1: 1 tab daily Week 2: 1 tab twice daily Week 3: 2 tabs in AM, 1 in evening Week 4: 2 tabs twice daily 03/30/18   Shelda Pal, DO  pantoprazole (PROTONIX) 40 MG tablet Take 1 tablet (40 mg total) by mouth daily. 03/30/18   Shelda Pal, DO    Family History Family History  Problem Relation Age of Onset  . Hypertension Mother   . Diabetes Father   . Stroke Father   . Hypertension Sister   . Asthma Son     Social History Social History   Tobacco Use  . Smoking status: Former Smoker    Years: 15.00    Types: Cigarettes    Last attempt to quit: 07/30/2015    Years since quitting: 2.6  . Smokeless tobacco: Never Used  . Tobacco comment: Cut down to 3-4 cigarettes/day  Substance Use Topics  . Alcohol use: No    Alcohol/week: 0.0 standard drinks  . Drug use: No     Allergies   Patient has no known allergies.   Review of Systems Review of Systems  Constitutional: Positive for fatigue. Negative for fever.  HENT: Negative for rhinorrhea  and sore throat.   Eyes: Negative for redness.  Respiratory: Negative for cough.   Cardiovascular: Negative for chest pain.  Gastrointestinal: Positive for abdominal pain and constipation. Negative for diarrhea, nausea and vomiting.  Endocrine: Positive for polydipsia and polyuria.  Genitourinary: Negative for dysuria.  Musculoskeletal: Negative for myalgias.  Skin: Negative for rash.  Neurological: Negative for headaches.     Physical Exam Updated Vital Signs BP (!) 135/99 (BP Location: Right Arm)   Pulse 88   Temp 98.7 F (37.1 C) (Oral)   Resp 18   Ht '5\' 7"'  (1.702 m)   Wt (!) 143.7 kg   SpO2 100%   BMI 49.62 kg/m   Physical Exam  Constitutional: He  appears well-developed and well-nourished.  HENT:  Head: Normocephalic and atraumatic.  Slightly dry mucous membranes.  Eyes: Conjunctivae are normal. Right eye exhibits no discharge. Left eye exhibits no discharge.  Neck: Normal range of motion. Neck supple.  Cardiovascular: Normal rate, regular rhythm and normal heart sounds.  Pulmonary/Chest: Effort normal and breath sounds normal. No respiratory distress. He has no wheezes. He has no rales.  Abdominal: Soft. There is no tenderness.  Neurological: He is alert.  Skin: Skin is warm and dry.  Psychiatric: He has a normal mood and affect.  Nursing note and vitals reviewed.    ED Treatments / Results  Labs (all labs ordered are listed, but only abnormal results are displayed) Labs Reviewed  CBC - Abnormal; Notable for the following components:      Result Value   WBC 10.8 (*)    All other components within normal limits  CBG MONITORING, ED - Abnormal; Notable for the following components:   Glucose-Capillary 399 (*)    All other components within normal limits  BASIC METABOLIC PANEL  URINALYSIS, ROUTINE W REFLEX MICROSCOPIC    EKG None  Radiology No results found.  Procedures Procedures (including critical care time)  Medications Ordered in ED Medications  sodium chloride 0.9 % bolus 1,000 mL (0 mLs Intravenous Stopped 04/05/18 1808)  sodium chloride 0.9 % bolus 1,000 mL (0 mLs Intravenous Stopped 04/05/18 1907)     Initial Impression / Assessment and Plan / ED Course  I have reviewed the triage vital signs and the nursing notes.  Pertinent labs & imaging results that were available during my care of the patient were reviewed by me and considered in my medical decision making (see chart for details).     Patient seen and examined.  Will hydrate, check labs.   Vital signs reviewed and are as follows: BP (!) 135/99 (BP Location: Right Arm)   Pulse 88   Temp 98.7 F (37.1 C) (Oral)   Resp 18   Ht '5\' 7"'  (1.702 m)    Wt (!) 143.7 kg   SpO2 100%   BMI 49.62 kg/m    7:22 PM patient hydrated 2 L.  Blood sugar down to 300.  Patient has been urinating in the emergency department.  Discussed lab results with patient.  Plan for discharged home at this time.  Discussed need for continued good fluid intake at home.  Patient has plans to follow-up with his primary care doctor in 2 days.  He has metformin at home.   Final Clinical Impressions(s) / ED Diagnoses   Final diagnoses:  Hyperglycemia without ketosis   Patient with hyperglycemia with an element of dehydration, sent from PCP today.  Blood sugar improved with hydration in emergency department.  No signs of DKA on  lab work.  Patient is drinking well and is urinating in the department.  Comfortable with discharge home at this time.  Patient has appropriate follow-up in 2 days.  ED Discharge Orders    None       Carlisle Cater, Hershal Coria 04/05/18 1923    Quintella Reichert, MD 04/08/18 0800

## 2018-04-06 ENCOUNTER — Ambulatory Visit: Payer: BC Managed Care – PPO | Admitting: Gastroenterology

## 2018-04-06 NOTE — Progress Notes (Signed)
[place] [facility] [address] [place], [place] [address] [phone]  Date:  04/07/2018   Name:  Ricky Zuniga   DOB:  08-03-1980   MRN:  [id number]  PCP:  Darreld Mclean, MD    Chief Complaint: Follow-up (on DM)   History of Present Illness:  Ricky Zuniga is a 37 y.o. very pleasant male patient who presents with the following:  Seen here 2 days ago for a DM check but glucose was too high to read and lg ketones in urine- sent to the ER for stabilization  He was asked to get a meter and testing supplies  He is feeling significantly better today.  He has brought in his new glucose meter and testing supplies, although he is not quite sure how to use the meter yet.  He is willing to start on a once a day injectable insulin today.  Lab Results  Component Value Date   HGBA1C 11.6 (H) 03/30/2018   Glucose measured at 458 in the office today using pt's own machine   Patient Active Problem List   Diagnosis Date Noted  . Esophageal dysphagia 03/30/2018  . Hypogonadism in male 02/24/2017  . Type 2 diabetes mellitus with hyperglycemia, without long-term current use of insulin ([place]) 02/24/2017  . Morbid obesity ([place]) 02/24/2017  . Eczema 05/23/2014  . Tobacco use disorder 05/23/2014  . Constipation 05/19/2014  . HTN (hypertension) 05/19/2014  . Rectal pain 05/21/2013  . Elevated blood pressure 05/21/2013    Past Medical History:  Diagnosis Date  . Hypertension   . Pre-diabetes     Past Surgical History:  Procedure Laterality Date  . NO PAST SURGERIES      Social History   Tobacco Use  . Smoking status: Former Smoker    Years: 15.00    Types: Cigarettes    Last attempt to quit: 07/30/2015    Years since quitting: 2.6  . Smokeless tobacco: Never Used  . Tobacco comment: Cut down to 3-4 cigarettes/day  Substance Use Topics  . Alcohol use: No    Alcohol/week: 0.0 standard drinks  . Drug use: No    Family  History  Problem Relation Age of Onset  . Hypertension Mother   . Diabetes Father   . Stroke Father   . Hypertension Sister   . Asthma Son     No Known Allergies  Medication list has been reviewed and updated.  Current Outpatient Medications on File Prior to Visit  Medication Sig Dispense Refill  . amLODipine (NORVASC) 10 MG tablet Take 1 tablet (10 mg total) by mouth daily. 90 tablet 1  . aspirin-acetaminophen-caffeine (EXCEDRIN MIGRAINE) 588-502-77 MG per tablet Take 2 tablets by mouth every 6 (six) hours as needed for pain.    . blood glucose meter kit and supplies Dispense based on patient and insurance preference. Use up to four times daily as directed. (FOR ICD-10 E10.9, E11.9). 1 each 0  . metFORMIN (GLUCOPHAGE) 500 MG tablet Week 1: 1 tab daily Week 2: 1 tab twice daily Week 3: 2 tabs in AM, 1 in evening Week 4: 2 tabs twice daily 120 tablet 1  . pantoprazole (PROTONIX) 40 MG tablet Take 1 tablet (40 mg total) by mouth daily. 30 tablet 1   No current facility-administered medications on file prior to visit.     Review of Systems:  As per HPI- otherwise negative.   Physical Examination: Vitals:   04/07/18 1552  BP: (!) 137/99  Pulse: 82  Temp: 98.6 F (37 C)  SpO2: 99%   Vitals:   04/07/18 1552  Weight: (!) 319 lb 6.4 oz (144.9 kg)  Height: '5\' 7"'  (1.702 m)   Body mass index is 50.03 kg/m. Ideal Body Weight: Weight in (lb) to have BMI = 25: 159.3  GEN: WDWN, NAD, Non-toxic, A & O x 3,looks well, obese  HEENT: Atraumatic, Normocephalic. Neck supple. No masses, No LAD. Ears and Nose: No external deformity. CV: RRR, No M/G/R. No JVD. No thrill. No extra heart sounds. PULM: CTA B, no wheezes, crackles, rhonchi. No retractions. No resp. distress. No accessory muscle use. EXTR: No c/c/e NEURO Normal gait.  PSYCH: Normally interactive. Conversant. Not depressed or anxious appearing.  Calm demeanor.   Showed patient how to use his glucose meter today. Also  demonstrated how to use his prefilled insulin pen, and gave him his first dose of 10 units of basilar today. Given a sample pen of basaglar  Assessment and Plan: Type 2 diabetes mellitus with hyperglycemia, without long-term current use of insulin (HCC) - Plan: Insulin Glargine (BASAGLAR KWIKPEN) 100 UNIT/ML SOPN, Insulin Pen Needle (PEN NEEDLES) 32G X 6 MM MISC   [name] is here today to start on insulin for his uncontrolled diabetes.  He has a meter with him and we went over how to use it today.  His blood sugar is still in the 450s.  We will start him on Basaglad for the time being.  Went over how to give his injection and gave him his first 10 units today. He will titrate his insulin up by 2 units every 2 days as long as his fasting blood sugar is greater than 150.  He is asked to contact me when he gets 20 units of insulin.  In the meantime he will continue to titrate up his metformin and is really dedicated to work on his diet and exercise.    He will let me know if he runs in any problems, but we went over how to use his meter and insulin pen in detail today and he states he feels confident in using both of these.  He has an appointment to see me in about 1 month. He also plans to contact me when he gets to 20 units of insulin so we can check on how he was doing. Signed Lamar Blinks, MD

## 2018-04-07 ENCOUNTER — Ambulatory Visit: Payer: BC Managed Care – PPO | Admitting: Family Medicine

## 2018-04-07 ENCOUNTER — Encounter: Payer: Self-pay | Admitting: Family Medicine

## 2018-04-07 VITALS — BP 137/99 | HR 82 | Temp 98.6°F | Ht 67.0 in | Wt 319.4 lb

## 2018-04-07 DIAGNOSIS — E1165 Type 2 diabetes mellitus with hyperglycemia: Secondary | ICD-10-CM | POA: Diagnosis not present

## 2018-04-07 MED ORDER — BASAGLAR KWIKPEN 100 UNIT/ML ~~LOC~~ SOPN: 10.0000 [IU] | PEN_INJECTOR | Freq: Every day | SUBCUTANEOUS | 6 refills | Status: DC

## 2018-04-07 MED ORDER — PEN NEEDLES 32G X 6 MM MISC: 1.0000 | Freq: Every day | 6 refills | Status: AC

## 2018-04-07 NOTE — Patient Instructions (Signed)
It was good to see you today!  Plan for insulin Check blood sugar in the morning,fasting. Write down the number As long as your blood sugar is higher than 150 we will increase your insulin by 2 units every 2 days 10, 10,12,12, 14,14 and so on Continue to increase your metformin as you had planned Work on moderate exercise and watch diet- avoid sweets and limit simple carbs such as pasta, bread, rice, potatoes  Please let me know when you get to 20 units of insulin  We will plan to meet back here in 2 months approx

## 2018-04-12 ENCOUNTER — Ambulatory Visit: Payer: BC Managed Care – PPO | Admitting: Gastroenterology

## 2018-04-15 ENCOUNTER — Telehealth: Payer: Self-pay

## 2018-04-15 NOTE — Telephone Encounter (Signed)
Copied from CRM [phone]. Topic: General - Inquiry >> Apr 15, 2018 12:08 PM Maia Pettiesrtiz, Kristie S wrote: Reason for CRM: pt called stating that Insulin Pen Needle (PEN NEEDLES) 32G X 6 MM MISC is too expensive and asking if there are any alternatives for him or samples. Please advise.

## 2018-04-16 ENCOUNTER — Encounter: Payer: Self-pay | Admitting: Family Medicine

## 2018-04-16 NOTE — Telephone Encounter (Signed)
Called him.  It looks like he can order these needles quite inexpensively on [employer].

## 2018-04-21 ENCOUNTER — Other Ambulatory Visit: Payer: Self-pay | Admitting: Family Medicine

## 2018-04-21 DIAGNOSIS — E1165 Type 2 diabetes mellitus with hyperglycemia: Secondary | ICD-10-CM

## 2018-04-21 DIAGNOSIS — R1310 Dysphagia, unspecified: Secondary | ICD-10-CM

## 2018-04-21 DIAGNOSIS — R1319 Other dysphagia: Secondary | ICD-10-CM

## 2018-05-09 ENCOUNTER — Other Ambulatory Visit: Payer: Self-pay | Admitting: Family Medicine

## 2018-05-09 NOTE — Progress Notes (Signed)
Ricky Zuniga at [facility] [address] [place], [place] [address] [phone]  Date:  05/12/2018   Name:  Ricky Zuniga   DOB:  1980-10-01   MRN:  [id number]  PCP:  Darreld Mclean, MD    Chief Complaint: Diabetes (6 month follow up, discuss insulin, sugar dropped to 64)   History of Present Illness:  Ricky Zuniga is a 38 y.o. very pleasant male patient who presents with the following:  Periodic follow-up visit today for this gentleman with history of type 2 diabetes, hypertension, obesity. He was in the office on 12/2, with very out-of-control diabetes.  He went to the ER for stabilization, and then 2 days later we started him on once a day basaglar.    He notes that he is taking metformin 1000 BID and is tolerating this okay He stopped taking insulin - he stopped it about 2 weeks ago as his glucose went too low.  He had a glucose reading of about 65, and felt weak and shaky.  He ate something and felt better.  However since then he has not been taking his insulin and notes that his blood sugar seems to be under control. His glucose is now running 90-120  He is trying to watch his diet and is avoiding carbs.  His weight is up a little bit, but we think he was artificially low due to dehydration previously  Wt Readings from Last 3 Encounters:  05/12/18 (!) 320 lb (145.2 kg)  04/07/18 (!) 319 lb 6.4 oz (144.9 kg)  04/05/18 (!) 316 lb 12.8 oz (143.7 kg)   He does not check home BP   BP Readings from Last 3 Encounters:  05/12/18 (!) 142/98  04/07/18 (!) 137/99  04/05/18 (!) 143/89   Flu shot: do today  Pneumonia vaccine: do today   Noted that his blood pressure is still a bit too high.  He is taking amlodipine 10 mg, he is okay with adding a second medication Lab Results  Component Value Date   HGBA1C 11.6 (H) 03/30/2018    Patient Active Problem List   Diagnosis Date Noted  . Esophageal dysphagia 03/30/2018  .  Hypogonadism in male 02/24/2017  . Type 2 diabetes mellitus with hyperglycemia, without long-term current use of insulin ([place]) 02/24/2017  . Morbid obesity ([place]) 02/24/2017  . Eczema 05/23/2014  . Tobacco use disorder 05/23/2014  . Constipation 05/19/2014  . HTN (hypertension) 05/19/2014  . Rectal pain 05/21/2013  . Elevated blood pressure 05/21/2013    Past Medical History:  Diagnosis Date  . Hypertension   . Pre-diabetes     Past Surgical History:  Procedure Laterality Date  . NO PAST SURGERIES      Social History   Tobacco Use  . Smoking status: Former Smoker    Years: 15.00    Types: Cigarettes    Last attempt to quit: 07/30/2015    Years since quitting: 2.7  . Smokeless tobacco: Never Used  . Tobacco comment: Cut down to 3-4 cigarettes/day  Substance Use Topics  . Alcohol use: No    Alcohol/week: 0.0 standard drinks  . Drug use: No    Family History  Problem Relation Age of Onset  . Hypertension Mother   . Diabetes Father   . Stroke Father   . Hypertension Sister   . Asthma Son     No Known Allergies  Medication list has been reviewed and updated.  Current Outpatient  Medications on File Prior to Visit  Medication Sig Dispense Refill  . amLODipine (NORVASC) 10 MG tablet Take 1 tablet (10 mg total) by mouth daily. 90 tablet 1  . aspirin-acetaminophen-caffeine (EXCEDRIN MIGRAINE) 818-403-75 MG per tablet Take 2 tablets by mouth every 6 (six) hours as needed for pain.    . blood glucose meter kit and supplies Dispense based on patient and insurance preference. Use up to four times daily as directed. (FOR ICD-10 E10.9, E11.9). 1 each 0  . glucose blood test strip USE AS DIRECTED 4 TIMES A DAY 400 each 1  . Insulin Pen Needle (PEN NEEDLES) 32G X 6 MM MISC 1 each by Does not apply route daily. 100 each 6  . Lancets (ONETOUCH DELICA PLUS [id number]) MISC USE AS DIRECTED 4 TIMES A DAY 100 each 1  . metFORMIN (GLUCOPHAGE) 500 MG tablet PLEASE SEE ATTACHED FOR  DETAILED DIRECTIONS 120 tablet 1  . pantoprazole (PROTONIX) 40 MG tablet TAKE 1 TABLET BY MOUTH EVERY DAY 30 tablet 1  . Insulin Glargine (BASAGLAR KWIKPEN) 100 UNIT/ML SOPN Inject 0.1 mLs (10 Units total) into the skin daily. Titrate up dosage as directed (Patient not taking: Reported on 05/12/2018) 15 mL 6   No current facility-administered medications on file prior to visit.     Review of Systems:  As per HPI- otherwise negative. No fever or chills, no chest pain or shortness of breath  Physical Examination: Vitals:   05/12/18 0911  BP: (!) 142/98  Pulse: 92  Resp: 16  Temp: 98.1 F (36.7 C)  SpO2: 97%   Vitals:   05/12/18 0911  Weight: (!) 320 lb (145.2 kg)  Height: '5\' 7"'  (1.702 m)   Body mass index is 50.12 kg/m. Ideal Body Weight: Weight in (lb) to have BMI = 25: 159.3  GEN: WDWN, NAD, Non-toxic, A & O x 3.  Obese, looks well HEENT: Atraumatic, Normocephalic. Neck supple. No masses, No LAD. Ears and Nose: No external deformity. CV: RRR, No M/G/R. No JVD. No thrill. No extra heart sounds. PULM: CTA B, no wheezes, crackles, rhonchi. No retractions. No resp. distress. No accessory muscle use. EXTR: No c/c/e NEURO Normal gait.  PSYCH: Normally interactive. Conversant. Not depressed or anxious appearing.  Calm demeanor.    Assessment and Plan: Type 2 diabetes mellitus with hyperglycemia, without long-term current use of insulin ([place]) - Plan: metFORMIN (GLUCOPHAGE) 1000 MG tablet  Essential hypertension - Plan: lisinopril (PRINIVIL,ZESTRIL) 10 MG tablet  Immunization due - Plan: Pneumococcal polysaccharide vaccine 23-valent greater than or equal to 2yo subcutaneous/IM  [name] is following up on his diabetes today.  He was fairly ill with very high blood sugars in December.  We put him on long-acting insulin.  However, since that time he has really changed his diet and his sugars were running low.  He stopped taking insulin about 2 weeks ago and notes that he has been  under good control with metformin at thousand twice a day.  He notes his blood sugars generally less than 150. It is too early to check an A1c today, I have ordered for him to have done as a lab visit only in about 2 months.  I ordered a future BMP, A1c, lipid He plans to see me in 4 months for recheck Flu and pneumonia vaccines given today Add lisinopril 10 mg for blood pressure  Signed Lamar Blinks, MD

## 2018-05-10 ENCOUNTER — Other Ambulatory Visit: Payer: Self-pay | Admitting: Family Medicine

## 2018-05-12 ENCOUNTER — Encounter: Payer: Self-pay | Admitting: Family Medicine

## 2018-05-12 ENCOUNTER — Ambulatory Visit: Payer: BC Managed Care – PPO | Admitting: Family Medicine

## 2018-05-12 VITALS — BP 142/98 | HR 92 | Temp 98.1°F | Resp 16 | Ht 67.0 in | Wt 320.0 lb

## 2018-05-12 DIAGNOSIS — Z23 Encounter for immunization: Secondary | ICD-10-CM | POA: Diagnosis not present

## 2018-05-12 DIAGNOSIS — E1165 Type 2 diabetes mellitus with hyperglycemia: Secondary | ICD-10-CM

## 2018-05-12 DIAGNOSIS — I10 Essential (primary) hypertension: Secondary | ICD-10-CM | POA: Diagnosis not present

## 2018-05-12 MED ORDER — METFORMIN HCL 1000 MG PO TABS: ORAL_TABLET | ORAL | 3 refills | Status: DC

## 2018-05-12 MED ORDER — LISINOPRIL 10 MG PO TABS: 10.0000 mg | ORAL_TABLET | Freq: Every day | ORAL | 3 refills | Status: DC

## 2018-05-12 NOTE — Patient Instructions (Addendum)
It was a pleasure to see you today, I am so glad you are doing well. It sounds like you do not need insulin at this time.  We will have you continue just you metformin.  Continue to work on [employer] and exercise.  As you lose more weight, we may be able to back off on some of your medication. I sent in a prescription for metformin 1000 mg twice a day, asked the pharmacy to fill this when you are running low. We will continue amlodipine for your blood pressure, and also add lisinopril 10 mg once a day  You got your flu and pneumonia vaccine today. It is too early to check an A1c, we can really only do this every 3 months.  I will place an order for an A1c, would you please come in for lab visit only in early March or late February and we will check this for you.  Call to make a lab appt before yo come in   Please see me in about 4 months for a recheck in the office

## 2018-05-24 ENCOUNTER — Other Ambulatory Visit: Payer: Self-pay | Admitting: Family Medicine

## 2018-05-24 DIAGNOSIS — R1319 Other dysphagia: Secondary | ICD-10-CM

## 2018-05-24 DIAGNOSIS — R1310 Dysphagia, unspecified: Secondary | ICD-10-CM

## 2018-05-24 DIAGNOSIS — E1165 Type 2 diabetes mellitus with hyperglycemia: Secondary | ICD-10-CM

## 2018-06-07 ENCOUNTER — Other Ambulatory Visit: Payer: Self-pay | Admitting: Family Medicine

## 2018-06-07 DIAGNOSIS — R1310 Dysphagia, unspecified: Secondary | ICD-10-CM

## 2018-06-07 DIAGNOSIS — E1165 Type 2 diabetes mellitus with hyperglycemia: Secondary | ICD-10-CM

## 2018-06-07 DIAGNOSIS — R1319 Other dysphagia: Secondary | ICD-10-CM

## 2018-06-28 ENCOUNTER — Other Ambulatory Visit (INDEPENDENT_AMBULATORY_CARE_PROVIDER_SITE_OTHER): Payer: BC Managed Care – PPO

## 2018-06-28 ENCOUNTER — Telehealth: Payer: Self-pay | Admitting: Family Medicine

## 2018-06-28 DIAGNOSIS — E1165 Type 2 diabetes mellitus with hyperglycemia: Secondary | ICD-10-CM

## 2018-06-28 LAB — BASIC METABOLIC PANEL
BUN: 12 mg/dL (ref 6–23)
CALCIUM: 9.3 mg/dL (ref 8.4–10.5)
CO2: 29 mEq/L (ref 19–32)
CREATININE: 0.96 mg/dL (ref 0.40–1.50)
Chloride: 104 mEq/L (ref 96–112)
GFR: 105.94 mL/min (ref >60.00)
Glucose, Bld: 111 mg/dL — ABNORMAL HIGH (ref 70–99)
Potassium: 4.3 mEq/L (ref 3.5–5.1)
Sodium: 139 mEq/L (ref 135–145)

## 2018-06-28 LAB — HEMOGLOBIN A1C: Hgb A1c MFr Bld: 8.4 % — ABNORMAL HIGH (ref 4.6–6.5)

## 2018-06-28 LAB — LIPID PANEL
Cholesterol: 129 mg/dL (ref 0–200)
HDL: 44.2 mg/dL (ref >39.00)
LDL Cholesterol: 75 mg/dL (ref 0–99)
NonHDL: 84.39
Total CHOL/HDL Ratio: 3
Triglycerides: 47 mg/dL (ref 0.0–149.0)
VLDL: 9.4 mg/dL (ref 0.0–40.0)

## 2018-06-28 NOTE — Telephone Encounter (Signed)
Received his labs as follows His hemoglobin A1c is down from 11.6 Lipids are favorable Letter to patient  Results for orders placed or performed in visit on 06/28/18  Lipid panel  Result Value Ref Range   Cholesterol 129 0 - 200 mg/dL   Triglycerides 63.7 0.0 - 149.0 mg/dL   HDL 85.88 >50.27 mg/dL   VLDL 9.4 0.0 - 74.1 mg/dL   LDL Cholesterol 75 0 - 99 mg/dL   Total CHOL/HDL Ratio 3    NonHDL 84.39   Hemoglobin A1c  Result Value Ref Range   Hgb A1c MFr Bld 8.4 (H) 4.6 - 6.5 %  Basic metabolic panel  Result Value Ref Range   Sodium 139 135 - 145 mEq/L   Potassium 4.3 3.5 - 5.1 mEq/L   Chloride 104 96 - 112 mEq/L   CO2 29 19 - 32 mEq/L   Glucose, Bld 111 (H) 70 - 99 mg/dL   BUN 12 6 - 23 mg/dL   Creatinine, Ser 2.87 0.40 - 1.50 mg/dL   Calcium 9.3 8.4 - 86.7 mg/dL   GFR 672.09 >47.09 mL/min

## 2018-07-13 ENCOUNTER — Other Ambulatory Visit: Payer: Self-pay | Admitting: Family Medicine

## 2018-07-13 DIAGNOSIS — R1319 Other dysphagia: Secondary | ICD-10-CM

## 2018-07-13 DIAGNOSIS — R1310 Dysphagia, unspecified: Secondary | ICD-10-CM

## 2018-08-07 ENCOUNTER — Other Ambulatory Visit: Payer: Self-pay | Admitting: Family Medicine

## 2018-08-07 DIAGNOSIS — I10 Essential (primary) hypertension: Secondary | ICD-10-CM

## 2018-09-09 NOTE — Progress Notes (Signed)
[place] at [facility] [address], [place], [place] [address] [phone]  Date:  09/13/2018   Name:  Ricky Zuniga   DOB:  09-Dec-1980   MRN:  [id number]  PCP:  Darreld Mclean, MD    Chief Complaint: No chief complaint on file.   History of Present Illness:  Ricky Zuniga is a 38 y.o. very pleasant male patient who presents with the following: Pt location is his car Provider location is office Pt ID confirmed with name and DOB, he gives consent for virtual visit today   Periodic follow-up visit today, for this patient with diabetes, obesity, hypertension  I last saw him in the office in January At that time he was taking metformin 1000 twice a day, had stopped his insulin due to low blood sugars He had been fairly ill with very high blood sugar in December, but came under control with relative ease We added lisinopril to his amlodipine in January due to elevated blood pressure  Lab Results  Component Value Date   HGBA1C 8.4 (H) 06/28/2018   His A1c was 11.6 in November, came down down to 8.4 in February  I wrote a letter to him suggesting that we start on an SGLT2 inhibitor, but he did not reply.  He decided to work on diet and exercise instead  Soon due for an A1c Foot exam is due Eye exam: this is due   He notes that his home glucose is running 90- 100 am fasting  He has been exercising for 30 minutes daily, walking and running and overall feels great  He is working out with his wife and kids; he has a 31 yo, 68 yo and 70 yo.  Also a 39 year old who is grown No low sugars noted He is taking metformin 1000 BID  He has been out of work- although getting paid- for some weeks. He hopes to be back at work soon  He works in the [employer] at [employer]  He is taking some sort of supplements for low testosterone over-the-counter.  I do not think this contains any dangerous steroids, so it is probably  safe.  Patient Active Problem List   Diagnosis Date Noted  . Esophageal dysphagia 03/30/2018  . Hypogonadism in male 02/24/2017  . Type 2 diabetes mellitus with hyperglycemia, without long-term current use of insulin ([place]) 02/24/2017  . Morbid obesity ([place]) 02/24/2017  . Eczema 05/23/2014  . Tobacco use disorder 05/23/2014  . Constipation 05/19/2014  . HTN (hypertension) 05/19/2014  . Rectal pain 05/21/2013  . Elevated blood pressure 05/21/2013    Past Medical History:  Diagnosis Date  . Hypertension   . Pre-diabetes     Past Surgical History:  Procedure Laterality Date  . NO PAST SURGERIES      Social History   Tobacco Use  . Smoking status: Former Smoker    Years: 15.00    Types: Cigarettes    Last attempt to quit: 07/30/2015    Years since quitting: 3.1  . Smokeless tobacco: Never Used  . Tobacco comment: Cut down to 3-4 cigarettes/day  Substance Use Topics  . Alcohol use: No    Alcohol/week: 0.0 standard drinks  . Drug use: No    Family History  Problem Relation Age of Onset  . Hypertension Mother   . Diabetes Father   . Stroke Father   . Hypertension Sister   . Asthma Son  No Known Allergies  Medication list has been reviewed and updated.  Current Outpatient Medications on File Prior to Visit  Medication Sig Dispense Refill  . amLODipine (NORVASC) 10 MG tablet TAKE 1 TABLET BY MOUTH EVERY DAY 90 tablet 1  . aspirin-acetaminophen-caffeine (EXCEDRIN MIGRAINE) 768-088-11 MG per tablet Take 2 tablets by mouth every 6 (six) hours as needed for pain.    . blood glucose meter kit and supplies Dispense based on patient and insurance preference. Use up to four times daily as directed. (FOR ICD-10 E10.9, E11.9). 1 each 0  . glucose blood test strip USE AS DIRECTED 4 TIMES A DAY 400 each 1  . Insulin Pen Needle (PEN NEEDLES) 32G X 6 MM MISC 1 each by Does not apply route daily. 100 each 6  . Lancets (ONETOUCH DELICA PLUS [id number]) MISC USE AS DIRECTED 4  TIMES A DAY 100 each 1  . lisinopril (PRINIVIL,ZESTRIL) 10 MG tablet Take 1 tablet (10 mg total) by mouth daily. 90 tablet 3  . metFORMIN (GLUCOPHAGE) 1000 MG tablet Take 1 pill twice daily for diabetes 180 tablet 3  . metFORMIN (GLUCOPHAGE) 500 MG tablet PLEASE SEE ATTACHED FOR DETAILED DIRECTIONS 360 tablet 1  . pantoprazole (PROTONIX) 40 MG tablet TAKE 1 TABLET BY MOUTH EVERY DAY 30 tablet 1   No current facility-administered medications on file prior to visit.     Review of Systems:  As per HPI- otherwise negative. He has not been sick, no cough or fever   Physical Examination: There were no vitals filed for this visit. There were no vitals filed for this visit. There is no height or weight on file to calculate BMI. Ideal Body Weight:    He thinks he has lost a few lbs  He is not checking his BP at home   Pt observed over video although we did have to change to phone due to poor sound. He looks well -no cough, wheezing, tachypnea or distress is noted  Assessment and Plan: Type 2 diabetes mellitus with hyperglycemia, without long-term current use of insulin (HCC)  Essential hypertension  Checking in regarding his diabetes today.  Ricky Zuniga is taking metformin thousand twice a day, and it sounds as though his sugars been under great control.  He is exercising most days, I praised his efforts I suggest that he obtain an extensive blood pressure monitor for home use.  He will think about this  Asked him to see me in the office in about 2 months, assuming this is possible, so we can check his blood pressure and labs  Signed Lamar Blinks, MD

## 2018-09-13 ENCOUNTER — Ambulatory Visit (INDEPENDENT_AMBULATORY_CARE_PROVIDER_SITE_OTHER): Payer: BC Managed Care – PPO | Admitting: Family Medicine

## 2018-09-13 ENCOUNTER — Encounter: Payer: Self-pay | Admitting: Family Medicine

## 2018-09-13 ENCOUNTER — Other Ambulatory Visit: Payer: Self-pay

## 2018-09-13 DIAGNOSIS — E1165 Type 2 diabetes mellitus with hyperglycemia: Secondary | ICD-10-CM | POA: Diagnosis not present

## 2018-09-13 DIAGNOSIS — I10 Essential (primary) hypertension: Secondary | ICD-10-CM

## 2018-09-13 NOTE — Patient Instructions (Signed)
It was great to talk to you today Congratulations on your exercise routine, it sounds like you are really trying hard to take care of yourself  As we discussed, please plan to see me in the office hopefully in July.  At that time we can check your blood pressure and A1c.  Of course with the pandemic this date may need to be flexible You might also consider getting an inexpensive blood pressure cuff for home so you can monitor your blood pressure a few times a week

## 2018-09-17 ENCOUNTER — Other Ambulatory Visit: Payer: Self-pay | Admitting: Family Medicine

## 2018-09-17 DIAGNOSIS — R1319 Other dysphagia: Secondary | ICD-10-CM

## 2018-09-17 DIAGNOSIS — R1310 Dysphagia, unspecified: Secondary | ICD-10-CM

## 2018-09-17 MED ORDER — PANTOPRAZOLE SODIUM 40 MG PO TBEC: 40.0000 mg | DELAYED_RELEASE_TABLET | Freq: Every day | ORAL | 1 refills | Status: DC

## 2018-10-08 ENCOUNTER — Other Ambulatory Visit: Payer: Self-pay | Admitting: Family Medicine

## 2018-10-08 DIAGNOSIS — R1319 Other dysphagia: Secondary | ICD-10-CM

## 2018-10-08 DIAGNOSIS — R1310 Dysphagia, unspecified: Secondary | ICD-10-CM

## 2018-10-08 MED ORDER — PANTOPRAZOLE SODIUM 40 MG PO TBEC: 40.0000 mg | DELAYED_RELEASE_TABLET | Freq: Every day | ORAL | 1 refills | Status: DC

## 2018-10-08 NOTE — Addendum Note (Signed)
Addended by: Steve Rattler A on: 10/08/2018 02:54 PM   Modules accepted: Orders

## 2018-11-09 NOTE — Progress Notes (Addendum)
[facility] [place], [place], [place] [address] [phone]  Date:  11/11/2018   Name:  Ricky Zuniga   DOB:  [birth year]   MRN:  [id number]  PCP:  Darreld Mclean, MD    Chief Complaint: Diabetes (2 month follow up)   History of Present Illness:  Ricky Zuniga is a 38 y.o. very pleasant male patient who presents with the following:  Short-term follow-up visit for this gentleman with history of diabetes, hypertension, obesity We had a virtual visit on May 11-at that time he reported very good control of his glucose He was exercising, taking metformin 1000 twice daily I asked him to monitor his blood pressure at home at that time if he was able He works for 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   He has not been exercising as much since the weather got hot, especially because the gyms are closed  He has 3 kids- his older kids are doing ok  His youngest will be in 2nd grade next fall and he really hope she will be able to attend  Eye exam: he needs to do this  Foot exam due today A1c due today  Amlodipine 10 Lisinopril 10 Metformin 1000 twice daily protonix  He will check his home glucose on occasion- he recently got a reading in the 70s; he was feeling a little shaky.  He ate and felt better.  This only happened one time He is not checking home BP really     Lab Results  Component Value Date   HGBA1C 8.4 (H) 06/28/2018   Wt Readings from Last 3 Encounters:  11/11/18 (!) 321 lb (145.6 kg)  05/12/18 (!) 320 lb (145.2 kg)  04/07/18 (!) 319 lb 6.4 oz (144.9 kg)   BP Readings from Last 3 Encounters:  11/11/18 134/90  05/12/18 (!) 142/98  04/07/18 (!) 137/99     Patient Active Problem List   Diagnosis Date Noted  . Esophageal dysphagia 03/30/2018  . Hypogonadism in male 02/24/2017  . Type 2 diabetes mellitus with hyperglycemia, without  long-term current use of insulin ([place]) 02/24/2017  . Morbid obesity ([place]) 02/24/2017  . Eczema 05/23/2014  . Tobacco use disorder 05/23/2014  . Constipation 05/19/2014  . HTN (hypertension) 05/19/2014  . Rectal pain 05/21/2013  . Elevated blood pressure 05/21/2013    Past Medical History:  Diagnosis Date  . Hypertension   . Pre-diabetes     Past Surgical History:  Procedure Laterality Date  . NO PAST SURGERIES      Social History   Tobacco Use  . Smoking status: Former Smoker    Years: 15.00    Types: Cigarettes    Quit date: 07/30/2015    Years since quitting: 3.2  . Smokeless tobacco: Never Used  . Tobacco comment: Cut down to 3-4 cigarettes/day  Substance Use Topics  . Alcohol use: No    Alcohol/week: 0.0 standard drinks  . Drug use: No    Family History  Problem Relation Age of Onset  . Hypertension Mother   . Diabetes Father   . Stroke Father   . Hypertension Sister   . Asthma Son     No Known Allergies  Medication list has been reviewed and updated.  Current Outpatient Medications on File Prior to Visit  Medication Sig Dispense Refill  . amLODipine (NORVASC) 10 MG  tablet TAKE 1 TABLET BY MOUTH EVERY DAY 90 tablet 1  . aspirin-acetaminophen-caffeine (EXCEDRIN MIGRAINE) 891-694-50 MG per tablet Take 2 tablets by mouth every 6 (six) hours as needed for pain.    . blood glucose meter kit and supplies Dispense based on patient and insurance preference. Use up to four times daily as directed. (FOR ICD-10 E10.9, E11.9). 1 each 0  . glucose blood test strip USE AS DIRECTED 4 TIMES A DAY 400 each 1  . Insulin Pen Needle (PEN NEEDLES) 32G X 6 MM MISC 1 each by Does not apply route daily. 100 each 6  . Lancets (ONETOUCH DELICA PLUS [id number]) MISC USE AS DIRECTED 4 TIMES A DAY 100 each 1  . lisinopril (PRINIVIL,ZESTRIL) 10 MG tablet Take 1 tablet (10 mg total) by mouth daily. 90 tablet 3  . metFORMIN (GLUCOPHAGE) 1000 MG tablet Take 1 pill twice daily for diabetes  180 tablet 3  . pantoprazole (PROTONIX) 40 MG tablet Take 1 tablet (40 mg total) by mouth daily. 90 tablet 1   No current facility-administered medications on file prior to visit.     Review of Systems:  As per HPI- otherwise negative.  No fever or chills, no chest pain or shortness of breath Physical Examination: Vitals:   11/11/18 1022  BP: 134/90  Pulse: 100  Resp: 18  Temp: 98.3 F (36.8 C)  SpO2: 97%   Vitals:   11/11/18 1022  Weight: (!) 321 lb (145.6 kg)  Height: _0  (1.702 m)   Body mass index is 50.28 kg/m. Ideal Body Weight: Weight in (lb) to have BMI = 25: 159.3  GEN: WDWN, NAD, Non-toxic, A & O x 3, obese, looks well  HEENT: Atraumatic, Normocephalic. Neck supple. No masses, No LAD. Ears and Nose: No external deformity. CV: RRR, No M/G/R. No JVD. No thrill. No extra heart sounds. PULM: CTA B, no wheezes, crackles, rhonchi. No retractions. No resp. distress. No accessory muscle use. EXTR: No c/c/e NEURO Normal gait.  PSYCH: Normally interactive. Conversant. Not depressed or anxious appearing.  Calm demeanor.  Foot exam normal today   Assessment and Plan:   ICD-10-CM   1. Type 2 diabetes mellitus with hyperglycemia, without long-term current use of insulin (HCC)  E11.65 CBC    Comprehensive metabolic panel    Hemoglobin A1c  2. Essential hypertension  I10 CBC    Comprehensive metabolic panel    lisinopril (ZESTRIL) 20 MG tablet  3. Morbid obesity ([place])  E66.01    Following up today- we did decide to increase lisinopril to 20 mg as BP is still borderline Discussed diet and exercise- encouraged him to work on gradual weight loss and he is determined to do better Will plan further follow- up pending labs. Continue other meds for now  Follow-up: No follow-ups on file.  Meds ordered this encounter  Medications  . lisinopril (ZESTRIL) 20 MG tablet    Sig: Take 1 tablet (20 mg total) by mouth daily.    Dispense:  90 tablet    Refill:  3   Orders  Placed This Encounter  Procedures  . CBC  . Comprehensive metabolic panel  . Hemoglobin A1c      Signed Lamar Blinks, MD  Received his labs, letter to patient Results for orders placed or performed in visit on 11/11/18  CBC  Result Value Ref Range   WBC 6.4 4.0 - 10.5 K/uL   RBC 4.71 4.22 - 5.81 Mil/uL   Platelets 271.0 150.0 - 400.0  K/uL   Hemoglobin 14.1 13.0 - 17.0 g/dL   HCT 43.0 39.0 - 52.0 %   MCV 91.2 78.0 - 100.0 fl   MCHC 32.8 30.0 - 36.0 g/dL   RDW 15.3 11.5 - 15.5 %  Comprehensive metabolic panel  Result Value Ref Range   Sodium 137 135 - 145 mEq/L   Potassium 4.3 3.5 - 5.1 mEq/L   Chloride 104 96 - 112 mEq/L   CO2 26 19 - 32 mEq/L   Glucose, Bld 142 (H) 70 - 99 mg/dL   BUN 13 6 - 23 mg/dL   Creatinine, Ser 0.97 0.40 - 1.50 mg/dL   Total Bilirubin 0.5 0.2 - 1.2 mg/dL   Alkaline Phosphatase 66 39 - 117 U/L   AST 16 0 - 37 U/L   ALT 23 0 - 53 U/L   Total Protein 6.9 6.0 - 8.3 g/dL   Albumin 4.4 3.5 - 5.2 g/dL   Calcium 9.4 8.4 - 10.5 mg/dL   GFR 104.48 >60.00 mL/min  Hemoglobin A1c  Result Value Ref Range   Hgb A1c MFr Bld 7.0 (H) 4.6 - 6.5 %

## 2018-11-11 ENCOUNTER — Ambulatory Visit: Payer: BC Managed Care – PPO | Admitting: Family Medicine

## 2018-11-11 ENCOUNTER — Encounter: Payer: Self-pay | Admitting: Family Medicine

## 2018-11-11 ENCOUNTER — Other Ambulatory Visit: Payer: Self-pay

## 2018-11-11 VITALS — BP 132/92 | HR 100 | Temp 98.3°F | Resp 18 | Ht 67.0 in | Wt 321.0 lb

## 2018-11-11 DIAGNOSIS — I10 Essential (primary) hypertension: Secondary | ICD-10-CM

## 2018-11-11 DIAGNOSIS — E1165 Type 2 diabetes mellitus with hyperglycemia: Secondary | ICD-10-CM | POA: Diagnosis not present

## 2018-11-11 LAB — COMPREHENSIVE METABOLIC PANEL
ALT: 23 U/L (ref 0–53)
AST: 16 U/L (ref 0–37)
Albumin: 4.4 g/dL (ref 3.5–5.2)
Alkaline Phosphatase: 66 U/L (ref 39–117)
BUN: 13 mg/dL (ref 6–23)
CO2: 26 mEq/L (ref 19–32)
Calcium: 9.4 mg/dL (ref 8.4–10.5)
Chloride: 104 mEq/L (ref 96–112)
Creatinine, Ser: 0.97 mg/dL (ref 0.40–1.50)
GFR: 104.48 mL/min (ref >60.00)
Glucose, Bld: 142 mg/dL — ABNORMAL HIGH (ref 70–99)
Potassium: 4.3 mEq/L (ref 3.5–5.1)
Sodium: 137 mEq/L (ref 135–145)
Total Bilirubin: 0.5 mg/dL (ref 0.2–1.2)
Total Protein: 6.9 g/dL (ref 6.0–8.3)

## 2018-11-11 LAB — HEMOGLOBIN A1C: Hgb A1c MFr Bld: 7 % — ABNORMAL HIGH (ref 4.6–6.5)

## 2018-11-11 LAB — CBC
HCT: 43 % (ref 39.0–52.0)
Hemoglobin: 14.1 g/dL (ref 13.0–17.0)
MCHC: 32.8 g/dL (ref 30.0–36.0)
MCV: 91.2 fl (ref 78.0–100.0)
Platelets: 271 10*3/uL (ref 150.0–400.0)
RBC: 4.71 Mil/uL (ref 4.22–5.81)
RDW: 15.3 % (ref 11.5–15.5)
WBC: 6.4 10*3/uL (ref 4.0–10.5)

## 2018-11-11 MED ORDER — LISINOPRIL 20 MG PO TABS: 20.0000 mg | ORAL_TABLET | Freq: Every day | ORAL | 3 refills | Status: DC

## 2018-11-11 NOTE — Patient Instructions (Addendum)
Good to see you today!  I will be in touch with your labs asap - will mail them to you We will increase your lisinopril from 10 to 20 mg to bring down your BP a bit Otherwise continue other medications  Do continue to work on exercise and diet for gradual weight loss

## 2019-02-07 ENCOUNTER — Other Ambulatory Visit: Payer: Self-pay | Admitting: Family Medicine

## 2019-02-07 DIAGNOSIS — I10 Essential (primary) hypertension: Secondary | ICD-10-CM

## 2019-04-24 ENCOUNTER — Other Ambulatory Visit: Payer: Self-pay | Admitting: Family Medicine

## 2019-04-24 DIAGNOSIS — R1319 Other dysphagia: Secondary | ICD-10-CM

## 2019-04-24 DIAGNOSIS — R1310 Dysphagia, unspecified: Secondary | ICD-10-CM

## 2019-04-28 IMAGING — CR DG CHEST 2V
2 series · 2 of 2 positions shown · non-contrast
Comparison: None.

CLINICAL DATA: Cough, chest pain.

EXAM:
CHEST - 2 VIEW

[chest pa]
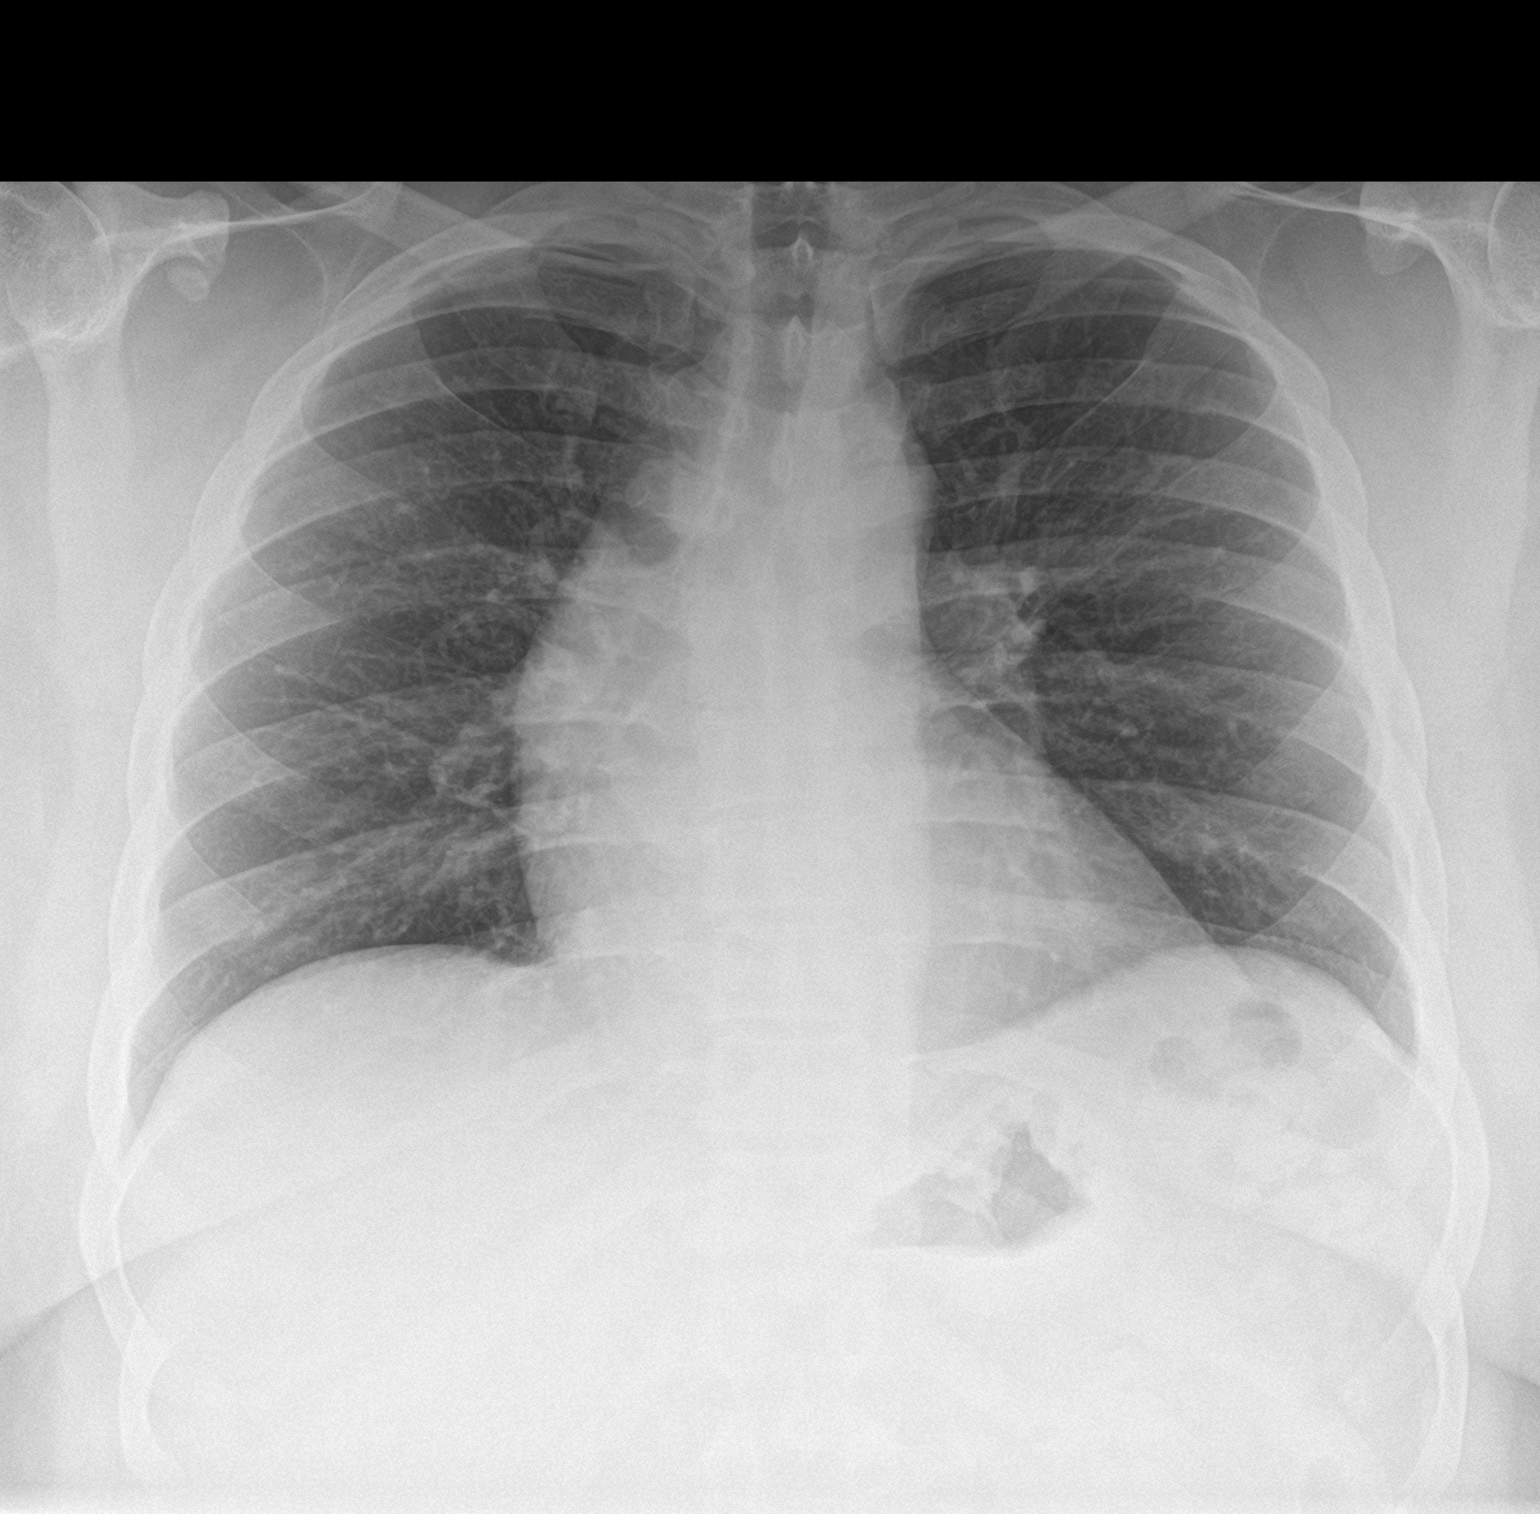

[chest lat]
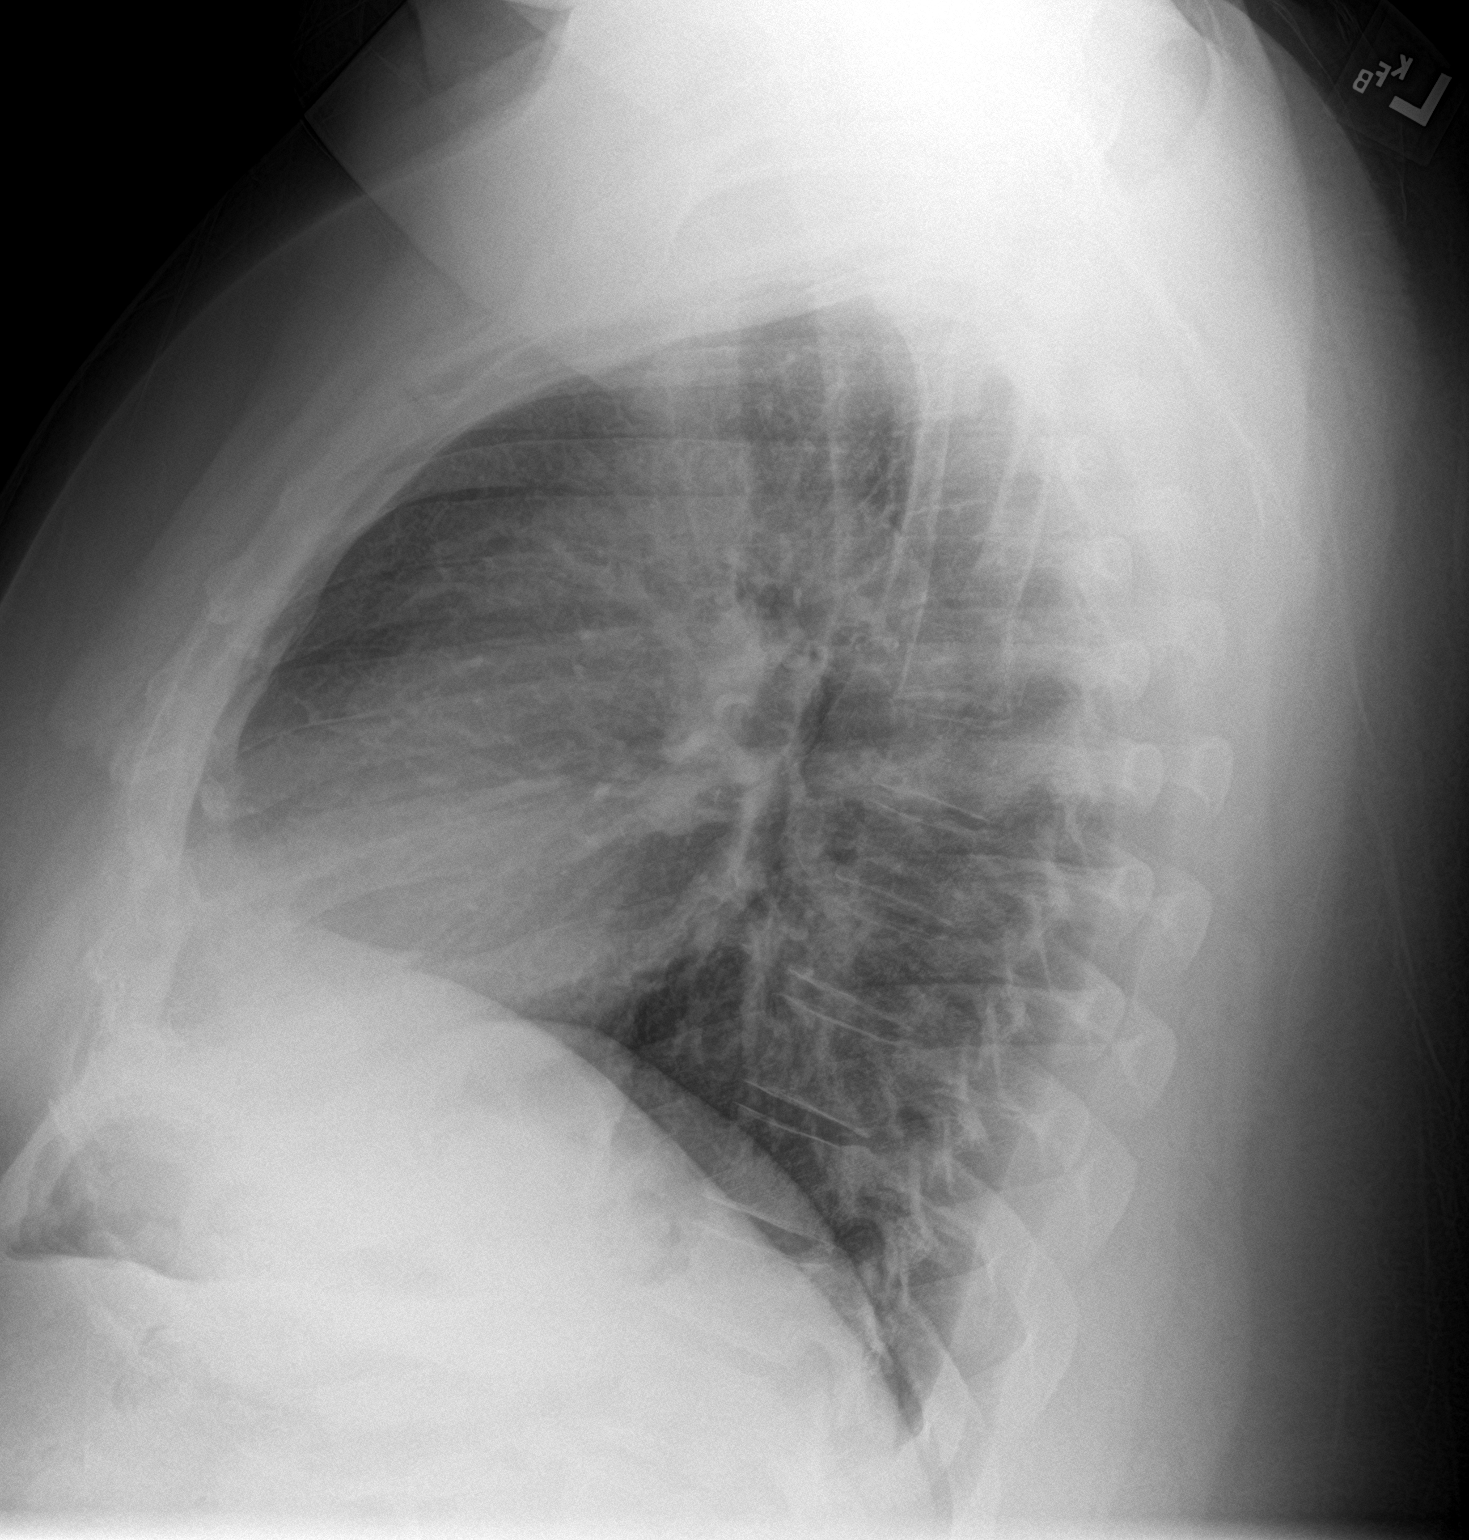

[2 of 2 positions shown; findings below may reference images not displayed]

FINDINGS: The heart size and mediastinal contours are within normal limits.
Both lungs are clear. No pneumothorax or pleural effusion is noted.
The visualized skeletal structures are unremarkable.
IMPRESSION: No active cardiopulmonary disease.

## 2019-05-13 ENCOUNTER — Telehealth: Payer: Self-pay

## 2019-05-13 NOTE — Telephone Encounter (Signed)
Copied from CRM [id number]. Topic: General - Other >> May 13, 2019 10:31 AM Jaquita Rector A wrote: Reason for CRM: Patient called to speak to Dr Patsy Lager in reference to his blood sugar and his testosterone levels. Asking for a call back please  Ph# [phone]

## 2019-05-13 NOTE — Telephone Encounter (Signed)
Tried calling patient, no answer-just rang and hung up three times.

## 2019-05-18 NOTE — Progress Notes (Addendum)
[facility] [place], [place],  [address] [phone]  Date:  05/19/2019   Name:  Ricky Zuniga   DOB:  04-Jun-1980   MRN:  [id number]  PCP:  Ricky Mclean, MD    Chief Complaint: Testosterone Level (energy, low sex drive, testicular swelling, no pain but irritated, couple of months)   History of Present Illness:  Ricky Zuniga is a 39 y.o. very pleasant male patient who presents with the following:  Patient with history of hypogonadism, obesity, diabetes, hypertension Last seen by myself in July 2020 He has 3 children, 2 are older and one is in the second grade- she just went back to in person schooling which is good for her   Here today with concern of LEFT testicular enlargement/ swelling for 3-4 months It is not painful, but feels "irritable" He is not aware of any injury or other cause of the swelling His right testicle seems to be unchanged  Most recent labs in July of this year Low testosterone in 2018-total 200 He feels like his T may be low due to lower energy level and sex drive His erections may not last as long as he needs them to in order to complete intercourse  Eye exam- reminded to schedule this  Flu vaccine- will give today A1c is due Lab Results  Component Value Date   HGBA1C 7.0 (H) 11/11/2018  Went over his current medications Amlodipine 10 metformin 1000 BID-admits that he is taking generally once a day, sometimes twice a day Lisinopril 20 He is taking both his BP meds on a regular basis  BP Readings from Last 3 Encounters:  05/19/19 (!) 150/102  11/11/18 (!) 132/92  05/12/18 (!) 142/98     Patient Active Problem List   Diagnosis Date Noted  . Esophageal dysphagia 03/30/2018  . Hypogonadism in male 02/24/2017  . Type 2 diabetes mellitus with hyperglycemia, without long-term current use of insulin ([place]) 02/24/2017  . Morbid obesity ([place]) 02/24/2017  . Eczema  05/23/2014  . Tobacco use disorder 05/23/2014  . Constipation 05/19/2014  . HTN (hypertension) 05/19/2014  . Rectal pain 05/21/2013    Past Medical History:  Diagnosis Date  . Hypertension   . Pre-diabetes     Past Surgical History:  Procedure Laterality Date  . NO PAST SURGERIES      Social History   Tobacco Use  . Smoking status: Former Smoker    Years: 15.00    Types: Cigarettes    Quit date: 07/30/2015    Years since quitting: 3.8  . Smokeless tobacco: Never Used  . Tobacco comment: Cut down to 3-4 cigarettes/day  Substance Use Topics  . Alcohol use: No    Alcohol/week: 0.0 standard drinks  . Drug use: No    Family History  Problem Relation Age of Onset  . Hypertension Mother   . Diabetes Father   . Stroke Father   . Hypertension Sister   . Asthma Son     No Known Allergies  Medication list has been reviewed and updated.  Current Outpatient Medications on File Prior to Visit  Medication Sig Dispense Refill  . amLODipine (NORVASC) 10 MG tablet TAKE 1 TABLET BY MOUTH EVERY DAY 90 tablet 1  . aspirin-acetaminophen-caffeine (EXCEDRIN MIGRAINE) [phone] MG per tablet Take 2 tablets by mouth every 6 (six) hours as needed for pain.    . blood glucose meter kit and supplies Dispense based on  patient and insurance preference. Use up to four times daily as directed. (FOR ICD-10 E10.9, E11.9). 1 each 0  . glucose blood test strip USE AS DIRECTED 4 TIMES A DAY 400 each 1  . Insulin Pen Needle (PEN NEEDLES) 32G X 6 MM MISC 1 each by Does not apply route daily. 100 each 6  . Lancets (ONETOUCH DELICA PLUS [id number]) MISC USE AS DIRECTED 4 TIMES A DAY 100 each 1  . lisinopril (ZESTRIL) 20 MG tablet Take 1 tablet (20 mg total) by mouth daily. 90 tablet 3  . metFORMIN (GLUCOPHAGE) 1000 MG tablet Take 1 pill twice daily for diabetes 180 tablet 3  . pantoprazole (PROTONIX) 40 MG tablet TAKE 1 TABLET BY MOUTH EVERY DAY 90 tablet 1   No current facility-administered  medications on file prior to visit.    Review of Systems:  As per HPI- otherwise negative.  No fever or chills, no chest pain or shortness of breath Physical Examination: Vitals:   05/19/19 1147  BP: (!) 150/102  Pulse: (!) 101  Resp: 17  Temp: (!) 97.5 F (36.4 C)  SpO2: 98%   Vitals:   05/19/19 1147  Weight: (!) 332 lb (150.6 kg)  Height: _0  (1.702 m)   Body mass index is 52 kg/m. Ideal Body Weight: Weight in (lb) to have BMI = 25: 159.3  GEN: WDWN, NAD, Non-toxic, A & O x 3, obese, otherwise looks well HEENT: Atraumatic, Normocephalic. Neck supple. No masses, No LAD. Ears and Nose: No external deformity. CV: RRR, No M/G/R. No JVD. No thrill. No extra heart sounds. PULM: CTA B, no wheezes, crackles, rhonchi. No retractions. No resp. distress. No accessory muscle use. ABD: S, NT, ND EXTR: No c/c/e NEURO Normal gait.  PSYCH: Normally interactive. Conversant. Not depressed or anxious appearing.  Calm demeanor.  Penis is normal.  Right testicle is generous but otherwise seems normal.  The left testicle and/or hemiscrotum is diffusely enlarged and firm.  I am uncertain if this is hydrocele or actual enlargement of the testicle.  It is not red, not tender   Assessment and Plan: Type 2 diabetes mellitus with hyperglycemia, without long-term current use of insulin (HCC) - Plan: Hemoglobin A1c, CBC, Lipid panel, Comprehensive metabolic panel  Essential hypertension - Plan: lisinopril (ZESTRIL) 40 MG tablet, Comprehensive metabolic panel  Morbid obesity ([place])  Enlarged testicle - Plan: Ambulatory referral to Urology  Here today with a couple of concerns Blood pressure is high, will increase lisinopril to 40 mg.  Continue amlodipine He is taking a decreased amount of Metformin, check A1c today Flu shot given Grossly enlarged left testicle, referral to urology ASAP.  He will let me know if any changes or concerns in the meantime [name] asked about medication for weight  loss today-I explained that there is not any highly effective weight loss medication available, but certainly if his T is low this may be contributing.  Would like to have his testosterone evaluated first, if normal I am glad to readdress with him  Will plan further follow- up pending labs. Moderate medical decision making  This visit occurred during the SARS-CoV-2 public health emergency.  Safety protocols were in place, including screening questions prior to the visit, additional usage of staff PPE, and extensive cleaning of exam room while observing appropriate contact time as indicated for disinfecting solutions.     Signed Lamar Blinks, MD  Received his labs, letter to pt  Results for orders placed or performed in visit on  05/19/19  Hemoglobin A1c  Result Value Ref Range   Hgb A1c MFr Bld 9.9 (H) 4.6 - 6.5 %  CBC  Result Value Ref Range   WBC 9.4 4.0 - 10.5 K/uL   RBC 4.83 4.22 - 5.81 Mil/uL   Platelets 263.0 150.0 - 400.0 K/uL   Hemoglobin 14.7 13.0 - 17.0 g/dL   HCT 44.3 39.0 - 52.0 %   MCV 91.8 78.0 - 100.0 fl   MCHC 33.0 30.0 - 36.0 g/dL   RDW 14.8 11.5 - 15.5 %  Lipid panel  Result Value Ref Range   Cholesterol 137 0 - 200 mg/dL   Triglycerides 122.0 0.0 - 149.0 mg/dL   HDL 45.10 >39.00 mg/dL   VLDL 24.4 0.0 - 40.0 mg/dL   LDL Cholesterol 68 0 - 99 mg/dL   Total CHOL/HDL Ratio 3    NonHDL 92.07   Comprehensive metabolic panel  Result Value Ref Range   Sodium 135 135 - 145 mEq/L   Potassium 4.5 3.5 - 5.1 mEq/L   Chloride 100 96 - 112 mEq/L   CO2 28 19 - 32 mEq/L   Glucose, Bld 340 (H) 70 - 99 mg/dL   BUN 15 6 - 23 mg/dL   Creatinine, Ser 1.02 0.40 - 1.50 mg/dL   Total Bilirubin 0.5 0.2 - 1.2 mg/dL   Alkaline Phosphatase 82 39 - 117 U/L   AST 11 0 - 37 U/L   ALT 21 0 - 53 U/L   Total Protein 6.9 6.0 - 8.3 g/dL   Albumin 4.2 3.5 - 5.2 g/dL   GFR 98.32 >60.00 mL/min   Calcium 9.7 8.4 - 10.5 mg/dL

## 2019-05-19 ENCOUNTER — Ambulatory Visit: Payer: BC Managed Care – PPO | Admitting: Family Medicine

## 2019-05-19 ENCOUNTER — Other Ambulatory Visit: Payer: Self-pay

## 2019-05-19 ENCOUNTER — Encounter: Payer: Self-pay | Admitting: Family Medicine

## 2019-05-19 VITALS — BP 140/100 | HR 101 | Temp 97.5°F | Resp 17 | Ht 67.0 in | Wt 332.0 lb

## 2019-05-19 DIAGNOSIS — I10 Essential (primary) hypertension: Secondary | ICD-10-CM

## 2019-05-19 DIAGNOSIS — E1165 Type 2 diabetes mellitus with hyperglycemia: Secondary | ICD-10-CM | POA: Diagnosis not present

## 2019-05-19 DIAGNOSIS — N5089 Other specified disorders of the male genital organs: Secondary | ICD-10-CM

## 2019-05-19 LAB — COMPREHENSIVE METABOLIC PANEL
ALT: 21 U/L (ref 0–53)
AST: 11 U/L (ref 0–37)
Albumin: 4.2 g/dL (ref 3.5–5.2)
Alkaline Phosphatase: 82 U/L (ref 39–117)
BUN: 15 mg/dL (ref 6–23)
CO2: 28 mEq/L (ref 19–32)
Calcium: 9.7 mg/dL (ref 8.4–10.5)
Chloride: 100 mEq/L (ref 96–112)
Creatinine, Ser: 1.02 mg/dL (ref 0.40–1.50)
GFR: 98.32 mL/min (ref >60.00)
Glucose, Bld: 340 mg/dL — ABNORMAL HIGH (ref 70–99)
Potassium: 4.5 mEq/L (ref 3.5–5.1)
Sodium: 135 mEq/L (ref 135–145)
Total Bilirubin: 0.5 mg/dL (ref 0.2–1.2)
Total Protein: 6.9 g/dL (ref 6.0–8.3)

## 2019-05-19 LAB — LIPID PANEL
Cholesterol: 137 mg/dL (ref 0–200)
HDL: 45.1 mg/dL (ref >39.00)
LDL Cholesterol: 68 mg/dL (ref 0–99)
NonHDL: 92.07
Total CHOL/HDL Ratio: 3
Triglycerides: 122 mg/dL (ref 0.0–149.0)
VLDL: 24.4 mg/dL (ref 0.0–40.0)

## 2019-05-19 LAB — CBC
HCT: 44.3 % (ref 39.0–52.0)
Hemoglobin: 14.7 g/dL (ref 13.0–17.0)
MCHC: 33 g/dL (ref 30.0–36.0)
MCV: 91.8 fl (ref 78.0–100.0)
Platelets: 263 10*3/uL (ref 150.0–400.0)
RBC: 4.83 Mil/uL (ref 4.22–5.81)
RDW: 14.8 % (ref 11.5–15.5)
WBC: 9.4 10*3/uL (ref 4.0–10.5)

## 2019-05-19 LAB — HEMOGLOBIN A1C: Hgb A1c MFr Bld: 9.9 % — ABNORMAL HIGH (ref 4.6–6.5)

## 2019-05-19 MED ORDER — LISINOPRIL 40 MG PO TABS: 40.0000 mg | ORAL_TABLET | Freq: Every day | ORAL | 3 refills | Status: DC

## 2019-05-19 NOTE — Patient Instructions (Signed)
It was good to see you today I will be in touch with your labs asap Flu shot given today Increase lisinopril to 40 mg daily for elevated BP I am going to set you up to see urology asap for your enlarged left testicle

## 2019-06-15 ENCOUNTER — Other Ambulatory Visit: Payer: Self-pay | Admitting: Family Medicine

## 2019-06-15 DIAGNOSIS — E1165 Type 2 diabetes mellitus with hyperglycemia: Secondary | ICD-10-CM

## 2019-07-18 ENCOUNTER — Other Ambulatory Visit: Payer: Self-pay | Admitting: Urology

## 2019-08-03 NOTE — Progress Notes (Signed)
Left message for Ricky Zuniga patient cannot be done at [facility] due to bmi 52.

## 2019-08-10 ENCOUNTER — Encounter (HOSPITAL_COMMUNITY): Payer: Self-pay

## 2019-08-10 NOTE — Patient Instructions (Signed)
DUE TO COVID-19 ONLY TWO VISITORS ARE ALLOWED TO COME WITH YOU AND STAY IN THE WAITING ROOM ONLY DURING PRE OP AND PROCEDURE. THE TWO VISITORS MAY VISIT WITH YOU IN YOUR PRIVATE ROOM DURING VISITING HOURS ONLY!!   COVID SWAB TESTING MUST BE COMPLETED ON:  Thursday, August 11, 2019 at 1:20PM [address], [facility] (Must self quarantine after testing. Follow instructions on handout.)         Your procedure is scheduled on: Monday, August 15, 2019   Report to [facility] Main  Entrance    Report to admitting at 11:30 AM   Call this number if you have problems the morning of surgery [phone]   Do not eat food or drink liquids :After Midnight.   Oral Hygiene is also important to reduce your risk of infection.                                    Remember - BRUSH YOUR TEETH THE MORNING OF SURGERY WITH YOUR REGULAR TOOTHPASTE   Do NOT smoke after Midnight   Take these medicines the morning of surgery with A SIP OF WATER: Amlodipine, Pantoprazole  DO NOT TAKE ANY ORAL DIABETIC MEDICATIONS DAY OF YOUR SURGERY                               You may not have any metal on your body including jewelry, and body piercings             Do not wear lotions, powders, perfumes/cologne, or deodorant                        Men may shave face and neck.   Do not bring valuables to the hospital. [place].   Contacts, dentures or bridgework may not be worn into surgery.   Bring small overnight bag day of surgery.    Patients discharged the day of surgery will not be allowed to drive home.   Name and phone number of your driver:   Special Instructions: Bring a copy of your healthcare power of attorney and living will documents         the day of surgery if you haven't scanned them in before.              Please read over the following fact sheets you were given:  How to  Manage Your Diabetes Before and After Surgery  Why is it important to control my blood sugar before and after surgery? . Improving blood sugar levels before and after surgery helps healing and can limit problems. . A way of improving blood sugar control is eating a healthy diet by: o  Eating less sugar and carbohydrates o  Increasing activity/exercise o  Talking with your doctor about reaching your blood sugar goals . High blood sugars (greater than 180 mg/dL) can raise your risk of infections and slow your recovery, so you will need to focus on controlling your diabetes during the weeks before surgery. . Make sure that the doctor who takes care of your diabetes knows about your planned surgery including the date and location.  How do I manage  my blood sugar before surgery? . Check your blood sugar at least 4 times a day, starting 2 days before surgery, to make sure that the level is not too high or low. o Check your blood sugar the morning of your surgery when you wake up and every 2 hours until you get to the Short Stay unit. . If your blood sugar is less than 70 mg/dL, you will need to treat for low blood sugar: o Do not take insulin. o Treat a low blood sugar (less than 70 mg/dL) with  cup of clear juice (cranberry or apple), 4 glucose tablets, OR glucose gel. o Recheck blood sugar in 15 minutes after treatment (to make sure it is greater than 70 mg/dL). If your blood sugar is not greater than 70 mg/dL on recheck, call [phone] for further instructions. . Report your blood sugar to the short stay nurse when you get to Short Stay.  . If you are admitted to the hospital after surgery: o Your blood sugar will be checked by the staff and you will probably be given insulin after surgery (instead of oral diabetes medicines) to make sure you have good blood sugar levels. o The goal for blood sugar control after surgery is 80-180 mg/dL.   WHAT DO I DO ABOUT MY DIABETES MEDICATION?  Marland Kitchen Do  not take oral diabetes medicines (pills) the morning of surgery.  Reviewed and Endorsed by [facility] Patient Education Committee, August 2015  [facility] - Preparing for Surgery Before surgery, you can play an important role.  Because skin is not sterile, your skin needs to be as free of germs as possible.  You can reduce the number of germs on your skin by washing with CHG (chlorahexidine gluconate) soap before surgery.  CHG is an antiseptic cleaner which kills germs and bonds with the skin to continue killing germs even after washing. Please DO NOT use if you have an allergy to CHG or antibacterial soaps.  If your skin becomes reddened/irritated stop using the CHG and inform your nurse when you arrive at Short Stay. Do not shave (including legs and underarms) for at least 48 hours prior to the first CHG shower.  You may shave your face/neck.  Please follow these instructions carefully:  1.  Shower with CHG Soap the night before surgery and the  morning of surgery.  2.  If you choose to wash your hair, wash your hair first as usual with your normal  shampoo.  3.  After you shampoo, rinse your hair and body thoroughly to remove the shampoo.                             4.  Use CHG as you would any other liquid soap.  You can apply chg directly to the skin and wash.  Gently with a scrungie or clean washcloth.  5.  Apply the CHG Soap to your body ONLY FROM THE NECK DOWN.   Do   not use on face/ open                           Wound or open sores. Avoid contact with eyes, ears mouth and   genitals (private parts).                       Wash face,  Genitals (private parts) with your normal soap.  6.  Wash thoroughly, paying special attention to the area where your    surgery  will be performed.  7.  Thoroughly rinse your body with warm water from the neck down.  8.  DO NOT shower/wash with your normal soap after using and rinsing off the CHG Soap.                9.  Pat yourself dry with  a clean towel.            10.  Wear clean pajamas.            11.  Place clean sheets on your bed the night of your first shower and do not  sleep with pets. Day of Surgery : Do not apply any lotions/deodorants the morning of surgery.  Please wear clean clothes to the hospital/surgery center.  FAILURE TO FOLLOW THESE INSTRUCTIONS MAY RESULT IN THE CANCELLATION OF YOUR SURGERY  PATIENT SIGNATURE_________________________________  NURSE SIGNATURE__________________________________  ________________________________________________________________________

## 2019-08-11 ENCOUNTER — Other Ambulatory Visit (HOSPITAL_COMMUNITY): Payer: BC Managed Care – PPO

## 2019-08-11 ENCOUNTER — Other Ambulatory Visit (HOSPITAL_COMMUNITY)
Admission: RE | Admit: 2019-08-11 | Discharge: 2019-08-11 | Disposition: A | Discharge status: Home / Self Care | Payer: BC Managed Care – PPO | Source: Ambulatory Visit | Attending: Urology | Admitting: Urology

## 2019-08-11 ENCOUNTER — Encounter (HOSPITAL_COMMUNITY)
Admission: RE | Admit: 2019-08-11 | Discharge: 2019-08-11 | Disposition: A | Discharge status: Home / Self Care | Payer: BC Managed Care – PPO | Source: Ambulatory Visit | Attending: Urology | Admitting: Urology

## 2019-08-11 ENCOUNTER — Encounter (HOSPITAL_COMMUNITY): Payer: Self-pay

## 2019-08-11 ENCOUNTER — Other Ambulatory Visit: Payer: Self-pay

## 2019-08-11 DIAGNOSIS — R9431 Abnormal electrocardiogram [ECG] [EKG]: Secondary | ICD-10-CM | POA: Insufficient documentation

## 2019-08-11 DIAGNOSIS — Z01818 Encounter for other preprocedural examination: Secondary | ICD-10-CM | POA: Insufficient documentation

## 2019-08-11 DIAGNOSIS — Z20822 Contact with and (suspected) exposure to covid-19: Secondary | ICD-10-CM | POA: Insufficient documentation

## 2019-08-11 HISTORY — DX: Obesity, unspecified: E66.9

## 2019-08-11 HISTORY — DX: Personal history of other diseases of the nervous system and sense organs: Z86.69

## 2019-08-11 HISTORY — DX: Type 2 diabetes mellitus without complications: E11.9

## 2019-08-11 HISTORY — DX: Hydrocele, unspecified: N43.3

## 2019-08-11 HISTORY — DX: Constipation, unspecified: K59.00

## 2019-08-11 HISTORY — DX: Gastro-esophageal reflux disease without esophagitis: K21.9

## 2019-08-11 LAB — BASIC METABOLIC PANEL
Anion gap: 6 (ref 5–15)
BUN: 14 mg/dL (ref 6–20)
CO2: 22 mmol/L (ref 22–32)
Calcium: 9.3 mg/dL (ref 8.9–10.3)
Chloride: 110 mmol/L (ref 98–111)
Creatinine, Ser: 1.05 mg/dL (ref 0.61–1.24)
GFR calc Af Amer: >60 mL/min (ref >60)
GFR calc non Af Amer: >60 mL/min (ref >60)
Glucose, Bld: 165 mg/dL — ABNORMAL HIGH (ref 70–99)
Potassium: 4.1 mmol/L (ref 3.5–5.1)
Sodium: 138 mmol/L (ref 135–145)

## 2019-08-11 LAB — CBC
HCT: 41.6 % (ref 39.0–52.0)
Hemoglobin: 13.3 g/dL (ref 13.0–17.0)
MCH: 29.6 pg (ref 26.0–34.0)
MCHC: 32 g/dL (ref 30.0–36.0)
MCV: 92.4 fL (ref 80.0–100.0)
Platelets: 278 10*3/uL (ref 150–400)
RBC: 4.5 MIL/uL (ref 4.22–5.81)
RDW: 15.3 % (ref 11.5–15.5)
WBC: 9.8 10*3/uL (ref 4.0–10.5)
nRBC: 0 % (ref 0.0–0.2)

## 2019-08-11 LAB — GLUCOSE, CAPILLARY: Glucose-Capillary: 155 mg/dL — ABNORMAL HIGH (ref 70–99)

## 2019-08-11 LAB — SARS CORONAVIRUS 2 (TAT 6-24 HRS): SARS Coronavirus 2: NEGATIVE

## 2019-08-11 NOTE — Progress Notes (Signed)
PCP - Dr. Shela Commons. Copland last office visit 05/19/19 in epic Cardiologist - N/A  Chest x-ray - greater than 1 year EKG - 08/11/19 in epic Stress Test - N/A ECHO - N/A Cardiac Cath - N/A  Sleep Study - N/A CPAP - N/A  Fasting Blood Sugar - 200's Checks Blood Sugar-Monthly  Blood Thinner Instructions: N/A Aspirin Instructions:N/A Last Dose:N/A  Anesthesia review: BMI 50.4, DM, HTN  Patient denies shortness of breath, fever, cough and chest pain at PAT appointment   Patient verbalized understanding of instructions that were given to them at the PAT appointment. Patient was also instructed that they will need to review over the PAT instructions again at home before surgery.

## 2019-08-12 LAB — HEMOGLOBIN A1C
Hgb A1c MFr Bld: 8.9 % — ABNORMAL HIGH (ref 4.8–5.6)
Mean Plasma Glucose: 209 mg/dL

## 2019-08-12 NOTE — Anesthesia Preprocedure Evaluation (Addendum)
Anesthesia Evaluation  Patient identified by MRN, date of birth, ID band Patient awake    Reviewed: Allergy & Precautions, NPO status , Patient's Chart, lab work & pertinent test results  History of Anesthesia Complications Negative for: history of anesthetic complications  Airway Mallampati: II  TM Distance: >3 FB Neck ROM: Full    Dental  (+) Missing,    Pulmonary Current Smoker and Patient abstained from smoking.,    Pulmonary exam normal        Cardiovascular hypertension, Pt. on medications Normal cardiovascular exam     Neuro/Psych negative neurological ROS  negative psych ROS   GI/Hepatic Neg liver ROS, GERD  Medicated and Controlled,  Endo/Other  diabetes, Type 2, Oral Hypoglycemic AgentsMorbid obesity  Renal/GU negative Renal ROS   Bilateral hydrocele    Musculoskeletal negative musculoskeletal ROS (+)   Abdominal   Peds  Hematology negative hematology ROS (+)   Anesthesia Other Findings Day of surgery medications reviewed with patient.  Reproductive/Obstetrics negative OB ROS                            Anesthesia Physical Anesthesia Plan  ASA: III  Anesthesia Plan: General   Post-op Pain Management:    Induction: Intravenous  PONV Risk Score and Plan: 2 and Treatment may vary due to age or medical condition, Midazolam, Ondansetron and Dexamethasone  Airway Management Planned: Oral ETT  Additional Equipment: None  Intra-op Plan:   Post-operative Plan: Extubation in OR  Informed Consent: I have reviewed the patients History and Physical, chart, labs and discussed the procedure including the risks, benefits and alternatives for the proposed anesthesia with the patient or authorized representative who has indicated his/her understanding and acceptance.     Dental advisory given  Plan Discussed with: CRNA  Anesthesia Plan Comments:       Anesthesia Quick  Evaluation

## 2019-08-12 NOTE — Progress Notes (Signed)
Hgb A1c results 08/11/19 faxed to Dr. Alvester Morin via epic.

## 2019-08-14 MED ORDER — DEXTROSE 5 % IV SOLN
3.0000 g | INTRAVENOUS | Status: AC
  Administered 2019-08-15: 3 g via INTRAVENOUS
  Filled 2019-08-14: qty 3

## 2019-08-15 ENCOUNTER — Telehealth (HOSPITAL_COMMUNITY): Payer: Self-pay | Admitting: *Deleted

## 2019-08-15 ENCOUNTER — Ambulatory Visit (HOSPITAL_COMMUNITY)
Admission: RE | Admit: 2019-08-15 | Discharge: 2019-08-15 | Disposition: A | Discharge status: Home / Self Care | Payer: BC Managed Care – PPO | Attending: Urology | Admitting: Urology

## 2019-08-15 ENCOUNTER — Encounter (HOSPITAL_COMMUNITY): Payer: Self-pay | Admitting: Urology

## 2019-08-15 ENCOUNTER — Ambulatory Visit (HOSPITAL_COMMUNITY): Payer: BC Managed Care – PPO | Admitting: Anesthesiology

## 2019-08-15 ENCOUNTER — Ambulatory Visit (HOSPITAL_COMMUNITY): Payer: BC Managed Care – PPO | Admitting: Physician Assistant

## 2019-08-15 ENCOUNTER — Encounter (HOSPITAL_COMMUNITY)
Admission: RE | Disposition: A | Discharge status: Home / Self Care | Payer: Self-pay | Source: Home / Self Care | Attending: Urology

## 2019-08-15 DIAGNOSIS — Z79899 Other long term (current) drug therapy: Secondary | ICD-10-CM | POA: Diagnosis not present

## 2019-08-15 DIAGNOSIS — E119 Type 2 diabetes mellitus without complications: Secondary | ICD-10-CM | POA: Insufficient documentation

## 2019-08-15 DIAGNOSIS — N433 Hydrocele, unspecified: Secondary | ICD-10-CM | POA: Insufficient documentation

## 2019-08-15 DIAGNOSIS — I10 Essential (primary) hypertension: Secondary | ICD-10-CM | POA: Diagnosis not present

## 2019-08-15 DIAGNOSIS — K219 Gastro-esophageal reflux disease without esophagitis: Secondary | ICD-10-CM | POA: Insufficient documentation

## 2019-08-15 HISTORY — PX: HYDROCELE EXCISION: SHX482

## 2019-08-15 LAB — GLUCOSE, CAPILLARY
Glucose-Capillary: 90 mg/dL (ref 70–99)
Glucose-Capillary: 111 mg/dL — ABNORMAL HIGH (ref 70–99)

## 2019-08-15 SURGERY — HYDROCELECTOMY
Anesthesia: General | Laterality: Bilateral

## 2019-08-15 MED ORDER — PROPOFOL 10 MG/ML IV BOLUS
INTRAVENOUS | Status: AC
  Filled 2019-08-15: qty 20

## 2019-08-15 MED ORDER — BUPIVACAINE HCL 0.25 % IJ SOLN
INTRAMUSCULAR | Status: AC
  Filled 2019-08-15: qty 1

## 2019-08-15 MED ORDER — PROMETHAZINE HCL 25 MG/ML IJ SOLN: 6.2500 mg | INTRAMUSCULAR | Status: DC | PRN

## 2019-08-15 MED ORDER — FENTANYL CITRATE (PF) 250 MCG/5ML IJ SOLN
INTRAMUSCULAR | Status: DC | PRN
  Administered 2019-08-15: 50 ug via INTRAVENOUS
  Administered 2019-08-15: 100 ug via INTRAVENOUS

## 2019-08-15 MED ORDER — OXYCODONE HCL 5 MG PO TABS
5.0000 mg | ORAL_TABLET | Freq: Once | ORAL | Status: AC | PRN
  Administered 2019-08-15: 5 mg via ORAL

## 2019-08-15 MED ORDER — LIDOCAINE 2% (20 MG/ML) 5 ML SYRINGE
INTRAMUSCULAR | Status: DC | PRN
  Administered 2019-08-15: 100 mg via INTRAVENOUS

## 2019-08-15 MED ORDER — SUCCINYLCHOLINE CHLORIDE 200 MG/10ML IV SOSY
PREFILLED_SYRINGE | INTRAVENOUS | Status: DC | PRN
  Administered 2019-08-15: 200 mg via INTRAVENOUS

## 2019-08-15 MED ORDER — BUPIVACAINE HCL (PF) 0.25 % IJ SOLN
INTRAMUSCULAR | Status: DC | PRN
  Administered 2019-08-15: 10 mL

## 2019-08-15 MED ORDER — ACETAMINOPHEN 500 MG PO TABS
1000.0000 mg | ORAL_TABLET | Freq: Once | ORAL | Status: AC
  Administered 2019-08-15: 12:00:00 1000 mg via ORAL
  Filled 2019-08-15: qty 2

## 2019-08-15 MED ORDER — PROPOFOL 10 MG/ML IV BOLUS
INTRAVENOUS | Status: DC | PRN
  Administered 2019-08-15: 200 mg via INTRAVENOUS

## 2019-08-15 MED ORDER — ROCURONIUM BROMIDE 10 MG/ML (PF) SYRINGE
PREFILLED_SYRINGE | INTRAVENOUS | Status: DC | PRN
  Administered 2019-08-15 (×2): 20 mg via INTRAVENOUS

## 2019-08-15 MED ORDER — LACTATED RINGERS IV SOLN: INTRAVENOUS | Status: DC

## 2019-08-15 MED ORDER — MIDAZOLAM HCL 2 MG/2ML IJ SOLN
INTRAMUSCULAR | Status: AC
  Filled 2019-08-15: qty 2

## 2019-08-15 MED ORDER — FENTANYL CITRATE (PF) 100 MCG/2ML IJ SOLN
INTRAMUSCULAR | Status: AC
  Filled 2019-08-15: qty 2

## 2019-08-15 MED ORDER — SUGAMMADEX SODIUM 500 MG/5ML IV SOLN
INTRAVENOUS | Status: DC | PRN
  Administered 2019-08-15: 294 mg via INTRAVENOUS

## 2019-08-15 MED ORDER — FENTANYL CITRATE (PF) 100 MCG/2ML IJ SOLN
25.0000 ug | INTRAMUSCULAR | Status: DC | PRN
  Administered 2019-08-15 (×2): 50 ug via INTRAVENOUS

## 2019-08-15 MED ORDER — ONDANSETRON HCL 4 MG/2ML IJ SOLN
INTRAMUSCULAR | Status: DC | PRN
  Administered 2019-08-15: 4 mg via INTRAVENOUS

## 2019-08-15 MED ORDER — HYDROCODONE-ACETAMINOPHEN 5-325 MG PO TABS: 1.0000 | ORAL_TABLET | ORAL | 0 refills | Status: DC | PRN

## 2019-08-15 MED ORDER — SUGAMMADEX SODIUM 500 MG/5ML IV SOLN
INTRAVENOUS | Status: AC
  Filled 2019-08-15: qty 5

## 2019-08-15 MED ORDER — OXYCODONE HCL 5 MG/5ML PO SOLN: 5.0000 mg | Freq: Once | ORAL | Status: AC | PRN

## 2019-08-15 MED ORDER — MIDAZOLAM HCL 5 MG/5ML IJ SOLN
INTRAMUSCULAR | Status: DC | PRN
  Administered 2019-08-15: 2 mg via INTRAVENOUS

## 2019-08-15 MED ORDER — 0.9 % SODIUM CHLORIDE (POUR BTL) OPTIME
TOPICAL | Status: DC | PRN
  Administered 2019-08-15: 13:00:00 1000 mL

## 2019-08-15 MED ORDER — DEXAMETHASONE SODIUM PHOSPHATE 10 MG/ML IJ SOLN
INTRAMUSCULAR | Status: DC | PRN
  Administered 2019-08-15: 6 mg via INTRAVENOUS

## 2019-08-15 MED ORDER — OXYCODONE HCL 5 MG PO TABS
ORAL_TABLET | ORAL | Status: AC
  Filled 2019-08-15: qty 1

## 2019-08-15 MED ORDER — FENTANYL CITRATE (PF) 250 MCG/5ML IJ SOLN
INTRAMUSCULAR | Status: AC
  Filled 2019-08-15: qty 5

## 2019-08-15 SURGICAL SUPPLY — 28 items
BLADE CLIPPER SURG (BLADE) ×3 IMPLANT
BLADE HEX COATED 2.75 (ELECTRODE) ×1 IMPLANT
BNDG GAUZE ELAST 4 BULKY (GAUZE/BANDAGES/DRESSINGS) ×3 IMPLANT
BRIEF STRETCH FOR OB PAD XXL (UNDERPADS AND DIAPERS) ×2 IMPLANT
COVER SURGICAL LIGHT HANDLE (MISCELLANEOUS) ×3 IMPLANT
COVER WAND RF STERILE (DRAPES) IMPLANT
DERMABOND ADVANCED (GAUZE/BANDAGES/DRESSINGS) ×2
DERMABOND ADVANCED .7 DNX12 (GAUZE/BANDAGES/DRESSINGS) ×1 IMPLANT
DRAIN PENROSE 0.25X18 (DRAIN) IMPLANT
DRAPE LAPAROTOMY T 98X78 PEDS (DRAPES) ×3 IMPLANT
ELECT REM PT RETURN 15FT ADLT (MISCELLANEOUS) ×3 IMPLANT
GLOVE BIO SURGEON STRL SZ7.5 (GLOVE) ×4 IMPLANT
GOWN STRL REUS W/TWL LRG LVL3 (GOWN DISPOSABLE) ×3 IMPLANT
KIT BASIN OR (CUSTOM PROCEDURE TRAY) ×3 IMPLANT
KIT TURNOVER KIT A (KITS) IMPLANT
NEEDLE HYPO 22GX1.5 SAFETY (NEEDLE) ×2 IMPLANT
NS IRRIG 1000ML POUR BTL (IV SOLUTION) ×3 IMPLANT
PACK GENERAL/GYN (CUSTOM PROCEDURE TRAY) ×3 IMPLANT
SUPPORT SCROTAL LG STRP (MISCELLANEOUS) ×2 IMPLANT
SUPPORTER ATHLETIC LG (MISCELLANEOUS) ×1
SUT CHROMIC 3 0 SH 27 (SUTURE) ×10 IMPLANT
SUT PDS AB 4-0 SH 27 (SUTURE) ×1 IMPLANT
SUT VIC AB 0 UR5 27 (SUTURE) ×1 IMPLANT
SUT VIC AB 2-0 UR5 27 (SUTURE) IMPLANT
SUT VICRYL 0 TIES 12 18 (SUTURE) IMPLANT
SYR CONTROL 10ML LL (SYRINGE) ×2 IMPLANT
TOWEL OR 17X26 10 PK STRL BLUE (TOWEL DISPOSABLE) ×4 IMPLANT
WATER STERILE IRR 1000ML POUR (IV SOLUTION) ×3 IMPLANT

## 2019-08-15 NOTE — Discharge Instructions (Signed)
Discharge instructions following scrotal surgery  Call your doctor for: Fever is greater than 100.5 Severe nausea or vomiting Increasing pain not controlled by pain medication Increasing redness or drainage from incisions  The number for questions or concerns is [phone]  Activity level: No lifting greater than 20 pounds (about equal to milk) for the next 2 weeks or until cleared to do so at follow-up appointment.  Otherwise activity as tolerated by comfort level.  Diet: May resume your regular diet as tolerated  Driving: No driving while still taking opiate pain medications (weight at least 6-8 hours after last dose).  No driving if you still sore from surgery as it may limit her ability to react quickly if necessary.   Shower/bath: May shower and get incision wet pad dry immediately following.  Do not scrub vigorously for the next 2-3 weeks.  Do not soak incision (ID soaking in bath or swimming) until told he may do so by Dr., as this may promote a wound infection.  Wound care: He may cover wounds with sterile gauze as needed to prevent incisions rubbing on close follow-up in any seepage.  Where tight fitting underpants/scrotal support for at least 2 weeks.  He should apply cold compresses (ice or sac of frozen peas/corn) to your scrotum for at least 48 hours to reduce the swelling for 15 minutes at a time indirectly.  You should expect that his scrotum will swell up initially and then get smaller over the next 2-4 weeks.  Follow-up appointments: Follow-up appointment will be scheduled with Dr. Braiden Presutti for a wound check.  

## 2019-08-15 NOTE — Anesthesia Procedure Notes (Signed)
Procedure Name: Intubation Date/Time: 08/15/2019 12:50 PM Performed by: Anne Fu, CRNA Pre-anesthesia Checklist: Patient identified, Emergency Drugs available, Suction available, Patient being monitored and Timeout performed Patient Re-evaluated:Patient Re-evaluated prior to induction Oxygen Delivery Method: Circle system utilized Preoxygenation: Pre-oxygenation with 100% oxygen Induction Type: IV induction Ventilation: Mask ventilation without difficulty Laryngoscope Size: Mac and 4 Grade View: Grade II Tube type: Oral Tube size: 7.5 mm Number of attempts: 1 Airway Equipment and Method: Stylet Placement Confirmation: ETT inserted through vocal cords under direct vision,  positive ETCO2 and breath sounds checked- equal and bilateral Secured at: 23 cm Tube secured with: Tape Dental Injury: Teeth and Oropharynx as per pre-operative assessment

## 2019-08-15 NOTE — H&P (Signed)
CC/HPI: CC: Scrotal swelling, erectile dysfunction  HPI:  06/08/2019  39 year old male with history of diabetes. Last A1c was 9.9. He has 2 month history of progressive left-sided scrotal swelling. He denies any trauma to the area. He does not have any pain. He states that it is starting to get in his way and is a nuisance. He also complains about erectile dysfunction. He has a difficult time obtaining a firm erection and maintaining. He has never tried Viagra or Cialis. He would like to try Cialis. He denies taking nitrates for chest pain.   07/15/2019  Patient tried Cialis. This has worked well for him. He underwent a scrotal ultrasound today that showed bilateral microlithiasis and confirmed bilateral hydroceles. There was no discrete mass within the testicles     ALLERGIES: No Allergies    MEDICATIONS: Lisinopril  Metformin Hcl  Amlodipine Besylate  Pantoprazole Sodium     GU PSH: No GU PSH    NON-GU PSH: No Non-GU PSH    GU PMH: ED due to arterial insufficiency - 06/08/2019 Hydrocele - 06/08/2019    NON-GU PMH: Diabetes Type 2 GERD Hypertension    FAMILY HISTORY: 2 daughters - Other 2 sons - Other   SOCIAL HISTORY: Marital Status: Married Preferred Language: English; Race: Black or African American Current Smoking Status: Patient smokes.   Tobacco Use Assessment Completed: Used Tobacco in last 30 days? Drinks 1 caffeinated drink per day.    REVIEW OF SYSTEMS:    GU Review Male:   Patient denies frequent urination, hard to postpone urination, burning/ pain with urination, get up at night to urinate, leakage of urine, stream starts and stops, trouble starting your stream, have to strain to urinate , erection problems, and penile pain.  Gastrointestinal (Upper):   Patient denies nausea, vomiting, and indigestion/ heartburn.  Gastrointestinal (Lower):   Patient denies diarrhea and constipation.  Constitutional:   Patient denies fever, night sweats, weight loss, and fatigue.   Skin:   Patient denies skin rash/ lesion and itching.  Eyes:   Patient denies blurred vision and double vision.  Ears/ Nose/ Throat:   Patient denies sore throat and sinus problems.  Hematologic/Lymphatic:   Patient denies easy bruising and swollen glands.  Cardiovascular:   Patient denies leg swelling and chest pains.  Respiratory:   Patient denies cough and shortness of breath.  Endocrine:   Patient denies excessive thirst.  Musculoskeletal:   Patient denies back pain and joint pain.  Neurological:   Patient denies headaches and dizziness.  Psychologic:   Patient denies depression and anxiety.   Notes: testicle skin irritation    VITAL SIGNS:      07/15/2019 10:48 AM  BP 147/101 mmHg  Heart Rate 80 /min  Temperature 98.6 F / 37 C   GU PHYSICAL EXAMINATION:    Testes: Bilateral hemiscrotal swelling, left greater than right consistent with bilateral hydroceles  Penis: Circumcised, no warts, no cracks. No dorsal Peyronie's plaques, no left corporal Peyronie's plaques, no right corporal Peyronie's plaques, no scarring, no warts. No balanitis, no meatal stenosis.   MULTI-SYSTEM PHYSICAL EXAMINATION:    Constitutional: Well-nourished. No physical deformities. Normally developed. Good grooming.  Respiratory: No labored breathing, no use of accessory muscles.   Cardiovascular: Normal temperature, adequate perfusion of extremities  Skin: No paleness, no jaundice  Neurologic / Psychiatric: Oriented to time, oriented to place, oriented to person. No depression, no anxiety, no agitation.  Gastrointestinal: No mass, no tenderness, no rigidity, obese abdomen.   Eyes: Normal  conjunctivae. Normal eyelids.  Musculoskeletal: Normal gait and station of head and neck.     PAST DATA REVIEWED:  Source Of History:  Patient  Urine Test Review:   Urinalysis  X-Ray Review: Scrotal Ultrasound: Reviewed Films. Discussed With Patient.     PROCEDURES:         Scrotal Ultrasound - [id number]  Right  Testicle: Length: 3.9 cm  Height: 2.8 cm  Width: 3.2 cm  Left Testicle: Length: 3.9 cm  Height: 2.7 cm  Width: 3.7 cm  Left Testis/Epididymis:  Testicle seen with blood flow, The testicle has the appearance of microlithiasis. Cystic area seen in epi head= 0.35cm, body and tail not seen well. Vessels not seen well. Hydrocele noted. Appendix testies noted.  Right Testis/Epididymis:  The testicle seen with blood flow, the testicle has the appearance of microlithiasis. Multiple cystic areas noted in the testicle, largest= 0.14cm. Multiple cystic areas seen in epi head, largest= 0.52cm, the body and the tail not seen well. Vessels not seen. Hydrocele noted. Appendix testies noted.      Patient confirmed No Neulasta OnPro Device.           Urinalysis Dipstick Dipstick Cont'd  Color: Yellow Bilirubin: Neg mg/dL  Appearance: Clear Ketones: Neg mg/dL  Specific Gravity: 6.213 Blood: Neg ery/uL  pH: 6.5 Protein: Trace mg/dL  Glucose: Neg mg/dL Urobilinogen: 1.0 mg/dL    Nitrites: Neg    Leukocyte Esterase: Neg leu/uL    ASSESSMENT:      ICD-10 Details  1 GU:   Hydrocele - N43.0 Undiagnosed New Problem  2   ED due to arterial insufficiency - N52.01 Undiagnosed New Problem   PLAN:           Schedule Procedure: Unspecified Date - Hydrocele repair - [phone], bilateral          Document Letter(s):  Created for Patient: Clinical Summary         Notes:   Continue Cialis as needed   He would like to proceed with hydrocelectomy. This will be bilateral. He understands potential for bleeding, infection, injury to surrounding structures, need for additional procedures, recurrence, wound dehiscence.   Cc: Dr. Patsy Lager   Signed by Modena Slater, III, M.D. on 07/15/19 at 11:17 AM (EST

## 2019-08-15 NOTE — Op Note (Signed)
.  Operative Note  Preoperative diagnosis:  1.  Bilateral hydrocele  Postoperative diagnosis: 1.  Bilateral hydrocele  Procedure(s): 1.  Bilateral hydrocelectomy  Surgeon: Modena Slater, MD  Assistants: none  Anesthesia: Gen.  Complications: none immediate  EBL: 25 mL  Specimens: 1. none  Drains/Catheters: 1. none  Condition following The operation: stable  Intraoperative findings: Bilateral hydrocele  Indication: the patient is a 39 year old man with scrotal swelling found to have a bilateral hydrocele confirmed by ultrasound.  Description of procedure:  The patient was identified and consent was obtained. He was taken back to the operating room and placed in the supine position. He was placed under general anesthesia and prepped and draped in the standard sterile fashion.Perioperative antibiotic was given. Timeout was performed. A 4 cm longitudinal incision was made along the median raphae. This was carried down through the dartos on the left and the testicle along with the hydrocele sac was delivered onto the operative field. The gubernacular attachments were released with a combination of blunt dissection and electrocautery. Spot electrocautery was used for hemostasis as necessary. Care was taken to not use electrocautery close to the cord itself. The hydrocele sac was incised and fluid was evacuated. I extended the hydrocele sac incision proximally and distally. Excess sac was excised. This was discarded. The hydrocele sac was everted around the testicle and loosely reapproximated with a 3-0 chromic. Hemostasis was obtained carefully with Bovie electrocautery. I irrigated the scrotal cavity and there was good hemostasis. The testicle was delivered back into the scrotum in its proper anatomical position.   An incision was carried down through the dartos on the right and the testicle along with the hydrocele sac was delivered onto the operative field. The gubernacular attachments  were released with a combination of blunt dissection and electrocautery. Spot electrocautery was used for hemostasis as necessary. Care was taken to not use electrocautery close to the cord itself. The hydrocele sac was incised and fluid was evacuated. I extended the hydrocele sac incision proximally and distally. Excess sac was excised. This was discarded. The hydrocele sac was everted around the testicle and loosely reapproximated with a 3-0 chromic. Hemostasis was obtained carefully with Bovie electrocautery. I irrigated the scrotal cavity and there was good hemostasis. The testicle was delivered back into the scrotum in its proper anatomical position.   The dartos was closed with a running 3-0 chromic. The skin was closed with interrupted 3-0 chromic sutures. 10 mL of quarter percent Marcaine without epinephrine was instilled for anesthetic affect. Dermabond was applied and then a dressing along with scrotal support was applied. This concluded the operation. The patient tolerated the procedure well and was stable postoperatively.  Plan: The patient will return in several weeks for postoperative wound check.

## 2019-08-15 NOTE — Transfer of Care (Signed)
Immediate Anesthesia Transfer of Care Note  Patient: Ricky Zuniga  Procedure(s) Performed: Procedure(s): HYDROCELECTOMY ADULT (Bilateral)  Patient Location: PACU  Anesthesia Type:General  Level of Consciousness:  sedated, patient cooperative and responds to stimulation  Airway & Oxygen Therapy:Patient Spontanous Breathing and Patient connected to face mask oxgen  Post-op Assessment:  Report given to PACU RN and Post -op Vital signs reviewed and stable  Post vital signs:  Reviewed and stable  Last Vitals:  Vitals:   08/15/19 1153  BP: (!) 157/107  Pulse: 72  Resp: 18  Temp: 36.7 C  SpO2: 99%    Complications: No apparent anesthesia complications

## 2019-08-16 ENCOUNTER — Encounter: Payer: Self-pay | Admitting: *Deleted

## 2019-08-16 NOTE — Anesthesia Postprocedure Evaluation (Signed)
Anesthesia Post Note  Patient: Ricky Zuniga  Procedure(s) Performed: HYDROCELECTOMY ADULT (Bilateral )     Patient location during evaluation: PACU Anesthesia Type: General Level of consciousness: awake and alert and oriented Pain management: pain level controlled Vital Signs Assessment: post-procedure vital signs reviewed and stable Respiratory status: spontaneous breathing, nonlabored ventilation and respiratory function stable Cardiovascular status: blood pressure returned to baseline Postop Assessment: no apparent nausea or vomiting Anesthetic complications: no    Last Vitals:  Vitals:   08/15/19 1445 08/15/19 1544  BP: (!) 158/105 (!) 166/108  Pulse: 81 72  Resp: 14 16  Temp: 36.7 C 36.6 C  SpO2: 93% 99%    Last Pain:  Vitals:   08/15/19 1544  TempSrc: Oral  PainSc: 4                  Kaylyn Layer

## 2019-09-03 ENCOUNTER — Other Ambulatory Visit: Payer: Self-pay | Admitting: Family Medicine

## 2019-09-03 DIAGNOSIS — I10 Essential (primary) hypertension: Secondary | ICD-10-CM

## 2019-09-10 ENCOUNTER — Other Ambulatory Visit: Payer: Self-pay | Admitting: Family Medicine

## 2019-09-10 DIAGNOSIS — R1310 Dysphagia, unspecified: Secondary | ICD-10-CM

## 2019-09-10 DIAGNOSIS — R1319 Other dysphagia: Secondary | ICD-10-CM

## 2019-11-26 ENCOUNTER — Other Ambulatory Visit: Payer: Self-pay | Admitting: Family Medicine

## 2019-11-26 DIAGNOSIS — E1165 Type 2 diabetes mellitus with hyperglycemia: Secondary | ICD-10-CM

## 2020-03-04 ENCOUNTER — Other Ambulatory Visit: Payer: Self-pay | Admitting: Family Medicine

## 2020-03-04 DIAGNOSIS — I10 Essential (primary) hypertension: Secondary | ICD-10-CM

## 2020-03-29 ENCOUNTER — Other Ambulatory Visit: Payer: Self-pay | Admitting: Family Medicine

## 2020-03-29 DIAGNOSIS — I10 Essential (primary) hypertension: Secondary | ICD-10-CM

## 2020-04-10 ENCOUNTER — Other Ambulatory Visit: Payer: Self-pay | Admitting: Family Medicine

## 2020-04-10 DIAGNOSIS — E1165 Type 2 diabetes mellitus with hyperglycemia: Secondary | ICD-10-CM

## 2020-04-10 DIAGNOSIS — I10 Essential (primary) hypertension: Secondary | ICD-10-CM

## 2020-06-16 ENCOUNTER — Other Ambulatory Visit: Payer: Self-pay | Admitting: Family Medicine

## 2020-06-16 DIAGNOSIS — I10 Essential (primary) hypertension: Secondary | ICD-10-CM

## 2020-06-28 ENCOUNTER — Other Ambulatory Visit: Payer: Self-pay | Admitting: Family Medicine

## 2020-06-28 DIAGNOSIS — I10 Essential (primary) hypertension: Secondary | ICD-10-CM

## 2020-07-20 ENCOUNTER — Other Ambulatory Visit: Payer: Self-pay | Admitting: Family Medicine

## 2020-07-20 DIAGNOSIS — E1165 Type 2 diabetes mellitus with hyperglycemia: Secondary | ICD-10-CM

## 2020-07-20 DIAGNOSIS — I10 Essential (primary) hypertension: Secondary | ICD-10-CM

## 2020-07-21 NOTE — Progress Notes (Addendum)
Ricky Zuniga at [facility] [place], [place], [place] [address] [phone]  Date:  07/23/2020   Name:  Ricky Zuniga   DOB:  Apr 20, 1981   MRN:  [id number]  PCP:  Ricky Mclean, MD    Chief Complaint: Diabetes and Hypertension (Been without medication)   History of Present Illness:  Ricky Zuniga is a 40 y.o. very pleasant male patient who presents with the following: Here today for a follow-up visit  Last seen by myself in January of 2021 history of hypogonadism, obesity, diabetes, hypertension One 3rd grader and 2 older kids-he notes his children are all doing well He was having an issue with testicular enlargement last year-[facility] did surgery for a hydrocele in April of 21 The hydrocele unfortunately seems to have come back  Lab Results  Component Value Date   HGBA1C 8.9 (H) 08/11/2019   Well overdue for labs   Eye exam- reminded to do this  Flu vaccine covid series- advised to do this asap  Needs foot exam -we will update today  He is taking lisinopril and metformin, ran out of amlodipine a month or so ago   He does check his home glucose- average in the 90s  He notes he is trying to work on getting more exercise and is hoping to lose weight.  He would like to see a nutritionist  Wt Readings from Last 3 Encounters:  07/23/20 (!) 323 lb (146.5 kg)  08/15/19 (!) 324 lb (147 kg)  08/11/19 (!) 324 lb (147 kg)     Patient Active Problem List   Diagnosis Date Noted  . Esophageal dysphagia 03/30/2018  . Hypogonadism in male 02/24/2017  . Type 2 diabetes mellitus with hyperglycemia, without long-term current use of insulin ([facility]) 02/24/2017  . Morbid obesity ([facility]) 02/24/2017  . Eczema 05/23/2014  . Tobacco use disorder 05/23/2014  . Constipation 05/19/2014  . HTN (hypertension) 05/19/2014  . Rectal pain 05/21/2013    Past Medical History:  Diagnosis Date  . Constipation   . Diabetes mellitus without  complication ([facility])   . GERD (gastroesophageal reflux disease)   . History of migraine   . Hydrocele, bilateral   . Hypertension   . Obesity   . Pre-diabetes     Past Surgical History:  Procedure Laterality Date  . HYDROCELE EXCISION Bilateral 08/15/2019   Procedure: HYDROCELECTOMY ADULT;  Surgeon: Ricky Mallow, MD;  Location: [facility];  Service: Urology;  Laterality: Bilateral;  . NO PAST SURGERIES      Social History   Tobacco Use  . Smoking status: Current Every Day Smoker    Packs/day: 1.00    Years: 15.00    Pack years: 15.00    Types: Cigarettes  . Smokeless tobacco: Never Used  Vaping Use  . Vaping Use: Never used  Substance Use Topics  . Alcohol use: No    Alcohol/week: 0.0 standard drinks  . Drug use: No    Family History  Problem Relation Age of Onset  . Hypertension Mother   . Diabetes Father   . Stroke Father   . Hypertension Sister   . Asthma Son     No Known Allergies  Medication list has been reviewed and updated.  Current Outpatient Medications on File Prior to Visit  Medication Sig Dispense Refill  . blood glucose meter kit and supplies Dispense based on patient and insurance preference. Use up to four times daily as directed. (FOR  ICD-10 E10.9, E11.9). 1 each 0  . glucose blood test strip USE AS DIRECTED 4 TIMES A DAY 400 each 1  . Insulin Pen Needle (PEN NEEDLES) 32G X 6 MM MISC 1 each by Does not apply route daily. 100 each 6  . Lancets (ONETOUCH DELICA PLUS [id number]) MISC USE AS DIRECTED 4 TIMES A DAY 100 each 1  . lisinopril (ZESTRIL) 40 MG tablet Take 1 tablet (40 mg total) by mouth daily. Pt needs OV for further refills 30 tablet 0  . MAGNESIUM PO Take 1 tablet by mouth daily.    . metFORMIN (GLUCOPHAGE) 1000 MG tablet Take 1 tablet (1,000 mg total) by mouth 2 (two) times daily with a meal. Pt needs OV for further refills 60 tablet 0  . pantoprazole (PROTONIX) 40 MG tablet Take 1 tablet (40 mg total) by mouth daily before breakfast. 90  tablet 2  . amLODipine (NORVASC) 10 MG tablet Take 1 tablet (10 mg total) by mouth daily. (Patient not taking: Reported on 07/23/2020) 30 tablet 0   No current facility-administered medications on file prior to visit.    Review of Systems:  As per HPI- otherwise negative.    Physical Examination: Vitals:   07/23/20 1344  BP: (!) 144/98  Pulse: 78  Resp: 18  SpO2: 98%   Vitals:   07/23/20 1344  Weight: (!) 323 lb (146.5 kg)  Height: '5\' 7"'  (1.702 m)   Body mass index is 50.59 kg/m. Ideal Body Weight: Weight in (lb) to have BMI = 25: 159.3    GEN: no acute distress.  Obese, otherwise looks well HEENT: Atraumatic, Normocephalic.  Ears and Nose: No external deformity. CV: RRR, No M/G/R. No JVD. No thrill. No extra heart sounds. PULM: CTA B, no wheezes, crackles, rhonchi. No retractions. No resp. distress. No accessory muscle use. ABD: S, NT, ND, +BS. No rebound. No HSM. EXTR: No c/c/e PSYCH: Normally interactive. Conversant.  Foot exam today-normal except he has hyperpigmented patches on the dorsum of both feet, over the first MCP joint.  Appears consistent with eczema or other irritative dermatitis  Assessment and Plan: Type 2 diabetes mellitus with hyperglycemia, without long-term current use of insulin ([facility]) - Plan: Hemoglobin A1c, metFORMIN (GLUCOPHAGE) 1000 MG tablet, Ambulatory referral to diabetic education  Morbid obesity ([facility]) - Plan: Lipid panel  Essential hypertension - Plan: CBC, Comprehensive metabolic panel, amLODipine (NORVASC) 10 MG tablet, lisinopril (ZESTRIL) 40 MG tablet  Hyperpigmentation - Plan: triamcinolone ointment (KENALOG) 0.5 %  Patient is here today for follow-up visit.  He is overdue for labs, update today Blood pressure is elevated, he is run out of amlodipine.  We will start back on this medication, continue lisinopril Refilled Metformin Will plan further follow- up pending labs. Encouraged him to continue working on diet and exercise.   Encouraged annual eye exam Recommended COVID-19 series This visit occurred during the SARS-CoV-2 public health emergency.  Safety protocols were in place, including screening questions prior to the visit, additional usage of staff PPE, and extensive cleaning of exam room while observing appropriate contact time as indicated for disinfecting solutions.    Signed Ricky Blinks, MD  Addendum 3/22, received labs as below.  Letter to patient  Results for orders placed or performed in visit on 07/23/20  CBC  Result Value Ref Range   WBC 6.3 4.0 - 10.5 K/uL   RBC 4.44 4.22 - 5.81 Mil/uL   Platelets 243.0 150.0 - 400.0 K/uL   Hemoglobin 13.4 13.0 - 17.0 g/dL  HCT 39.9 39.0 - 52.0 %   MCV 89.9 78.0 - 100.0 fl   MCHC 33.6 30.0 - 36.0 g/dL   RDW 15.6 (H) 11.5 - 15.5 %  Comprehensive metabolic panel  Result Value Ref Range   Sodium 139 135 - 145 mEq/L   Potassium 4.2 3.5 - 5.1 mEq/L   Chloride 106 96 - 112 mEq/L   CO2 25 19 - 32 mEq/L   Glucose, Bld 123 (H) 70 - 99 mg/dL   BUN 15 6 - 23 mg/dL   Creatinine, Ser 0.99 0.40 - 1.50 mg/dL   Total Bilirubin 0.6 0.2 - 1.2 mg/dL   Alkaline Phosphatase 63 39 - 117 U/L   AST 19 0 - 37 U/L   ALT 31 0 - 53 U/L   Total Protein 6.5 6.0 - 8.3 g/dL   Albumin 4.4 3.5 - 5.2 g/dL   GFR 95.43 >60.00 mL/min   Calcium 9.6 8.4 - 10.5 mg/dL  Hemoglobin A1c  Result Value Ref Range   Hgb A1c MFr Bld 7.1 (H) 4.6 - 6.5 %  Lipid panel  Result Value Ref Range   Cholesterol 137 0 - 200 mg/dL   Triglycerides 70.0 0.0 - 149.0 mg/dL   HDL 45.80 >39.00 mg/dL   VLDL 14.0 0.0 - 40.0 mg/dL   LDL Cholesterol 78 0 - 99 mg/dL   Total CHOL/HDL Ratio 3    NonHDL 91.53

## 2020-07-21 NOTE — Patient Instructions (Addendum)
Good to see you again today.  I will be in touch with your labs asap Please get your eyes checked annually  Restart on your amlodipine, continue other medications   We will also get you set up with a nutrition consult   Please plan to see me in 4-6 months

## 2020-07-23 ENCOUNTER — Encounter: Payer: Self-pay | Admitting: Family Medicine

## 2020-07-23 ENCOUNTER — Other Ambulatory Visit: Payer: Self-pay

## 2020-07-23 ENCOUNTER — Ambulatory Visit: Payer: BC Managed Care – PPO | Admitting: Family Medicine

## 2020-07-23 VITALS — BP 144/98 | HR 78 | Resp 18 | Ht 67.0 in | Wt 323.0 lb

## 2020-07-23 DIAGNOSIS — L819 Disorder of pigmentation, unspecified: Secondary | ICD-10-CM | POA: Diagnosis not present

## 2020-07-23 DIAGNOSIS — E1165 Type 2 diabetes mellitus with hyperglycemia: Secondary | ICD-10-CM | POA: Diagnosis not present

## 2020-07-23 DIAGNOSIS — I10 Essential (primary) hypertension: Secondary | ICD-10-CM | POA: Diagnosis not present

## 2020-07-23 MED ORDER — LISINOPRIL 40 MG PO TABS: 40.0000 mg | ORAL_TABLET | Freq: Every day | ORAL | 3 refills | Status: DC

## 2020-07-23 MED ORDER — METFORMIN HCL 1000 MG PO TABS: 1000.0000 mg | ORAL_TABLET | Freq: Two times a day (BID) | ORAL | 3 refills | Status: AC

## 2020-07-23 MED ORDER — AMLODIPINE BESYLATE 10 MG PO TABS: 10.0000 mg | ORAL_TABLET | Freq: Every day | ORAL | 3 refills | Status: DC

## 2020-07-23 MED ORDER — TRIAMCINOLONE ACETONIDE 0.5 % EX OINT: 1.0000 "application " | TOPICAL_OINTMENT | Freq: Two times a day (BID) | CUTANEOUS | 2 refills | Status: AC

## 2020-07-24 LAB — COMPREHENSIVE METABOLIC PANEL
ALT: 31 U/L (ref 0–53)
AST: 19 U/L (ref 0–37)
Albumin: 4.4 g/dL (ref 3.5–5.2)
Alkaline Phosphatase: 63 U/L (ref 39–117)
BUN: 15 mg/dL (ref 6–23)
CO2: 25 mEq/L (ref 19–32)
Calcium: 9.6 mg/dL (ref 8.4–10.5)
Chloride: 106 mEq/L (ref 96–112)
Creatinine, Ser: 0.99 mg/dL (ref 0.40–1.50)
GFR: 95.43 mL/min (ref >60.00)
Glucose, Bld: 123 mg/dL — ABNORMAL HIGH (ref 70–99)
Potassium: 4.2 mEq/L (ref 3.5–5.1)
Sodium: 139 mEq/L (ref 135–145)
Total Bilirubin: 0.6 mg/dL (ref 0.2–1.2)
Total Protein: 6.5 g/dL (ref 6.0–8.3)

## 2020-07-24 LAB — LIPID PANEL
Cholesterol: 137 mg/dL (ref 0–200)
HDL: 45.8 mg/dL (ref >39.00)
LDL Cholesterol: 78 mg/dL (ref 0–99)
NonHDL: 91.53
Total CHOL/HDL Ratio: 3
Triglycerides: 70 mg/dL (ref 0.0–149.0)
VLDL: 14 mg/dL (ref 0.0–40.0)

## 2020-07-24 LAB — CBC
HCT: 39.9 % (ref 39.0–52.0)
Hemoglobin: 13.4 g/dL (ref 13.0–17.0)
MCHC: 33.6 g/dL (ref 30.0–36.0)
MCV: 89.9 fl (ref 78.0–100.0)
Platelets: 243 10*3/uL (ref 150.0–400.0)
RBC: 4.44 Mil/uL (ref 4.22–5.81)
RDW: 15.6 % — ABNORMAL HIGH (ref 11.5–15.5)
WBC: 6.3 10*3/uL (ref 4.0–10.5)

## 2020-07-24 LAB — HEMOGLOBIN A1C: Hgb A1c MFr Bld: 7.1 % — ABNORMAL HIGH (ref 4.6–6.5)

## 2020-09-19 ENCOUNTER — Other Ambulatory Visit: Payer: Self-pay | Admitting: Family Medicine

## 2020-09-19 DIAGNOSIS — R1319 Other dysphagia: Secondary | ICD-10-CM

## 2021-08-12 ENCOUNTER — Other Ambulatory Visit: Payer: Self-pay | Admitting: Family Medicine

## 2021-08-12 DIAGNOSIS — I10 Essential (primary) hypertension: Secondary | ICD-10-CM

## 2021-08-13 ENCOUNTER — Other Ambulatory Visit: Payer: Self-pay | Admitting: Family Medicine

## 2021-08-13 DIAGNOSIS — I10 Essential (primary) hypertension: Secondary | ICD-10-CM

## 2021-08-25 ENCOUNTER — Other Ambulatory Visit: Payer: Self-pay | Admitting: Family Medicine

## 2021-08-25 DIAGNOSIS — I10 Essential (primary) hypertension: Secondary | ICD-10-CM

## 2022-12-26 ENCOUNTER — Telehealth: Payer: Self-pay | Admitting: Family Medicine

## 2022-12-26 NOTE — Telephone Encounter (Signed)
Patient last seen 03.2023- spoke with patients wife due to the patient phone number being out of service- stated she will have him call back to schedule and change number

## 2023-10-05 ENCOUNTER — Ambulatory Visit (HOSPITAL_COMMUNITY)
Admission: EM | Admit: 2023-10-05 | Discharge: 2023-10-05 | Disposition: A | Discharge status: Home / Self Care | Attending: Family Medicine | Admitting: Family Medicine

## 2023-10-05 ENCOUNTER — Ambulatory Visit (INDEPENDENT_AMBULATORY_CARE_PROVIDER_SITE_OTHER)

## 2023-10-05 ENCOUNTER — Encounter (HOSPITAL_COMMUNITY): Payer: Self-pay

## 2023-10-05 DIAGNOSIS — M25572 Pain in left ankle and joints of left foot: Secondary | ICD-10-CM | POA: Diagnosis not present

## 2023-10-05 MED ORDER — MELOXICAM 15 MG PO TABS: ORAL_TABLET | ORAL | 0 refills | Status: AC

## 2023-10-05 NOTE — ED Provider Notes (Signed)
 [facility]    CSN: [id number] Arrival date & time: 10/05/23  1108      History   Chief Complaint Chief Complaint  Patient presents with   Ankle Pain    HPI Ricky Zuniga is a 43 y.o. male.   Patient presenting for left ankle pain.  Patient notes that he was chasing his dog yesterday when his ankle twisted.  Patient states that he has some pain with ambulation and has been wearing a brace he had previously.  Patient is some swelling of the ankle.  No pain over the tibial area.  No other concerns at this time.   Ankle Pain   Past Medical History:  Diagnosis Date   Constipation    Diabetes mellitus without complication (HCC)    GERD (gastroesophageal reflux disease)    History of migraine    Hydrocele, bilateral    Hypertension    Obesity    Pre-diabetes     Patient Active Problem List   Diagnosis Date Noted   Esophageal dysphagia 03/30/2018   Hypogonadism in male 02/24/2017   Type 2 diabetes mellitus with hyperglycemia, without long-term current use of insulin  (HCC) 02/24/2017   Morbid obesity (HCC) 02/24/2017   Eczema 05/23/2014   Tobacco use disorder 05/23/2014   Constipation 05/19/2014   HTN (hypertension) 05/19/2014   Rectal pain 05/21/2013    Past Surgical History:  Procedure Laterality Date   HYDROCELE EXCISION Bilateral 08/15/2019   Procedure: HYDROCELECTOMY ADULT;  Surgeon: Samson Croak, MD;  Location: [facility];  Service: Urology;  Laterality: Bilateral;   NO PAST SURGERIES         Home Medications    Prior to Admission medications   Medication Sig Start Date End Date Taking? Authorizing Provider  meloxicam (MOBIC) 15 MG tablet Take 1 tablet daily with food for 7 days. Then take as needed. 10/05/23  Yes Jude Norton, MD  amLODipine  (NORVASC ) 10 MG tablet TAKE 1 TABLET BY MOUTH EVERY DAY 08/12/21   Copland, Jessica C, MD  blood glucose meter kit and supplies Dispense based on patient and insurance preference. Use up to four times daily  as directed. (FOR ICD-10 E10.9, E11.9). 04/05/18   Copland, Skipper Dumas, MD  glucose blood test strip USE AS DIRECTED 4 TIMES A DAY 05/10/18   Copland, Skipper Dumas, MD  Insulin  Pen Needle (PEN NEEDLES) 32G X 6 MM MISC 1 each by Does not apply route daily. 04/07/18   Copland, Skipper Dumas, MD  Lancets (ONETOUCH DELICA PLUS LANCET33G) MISC USE AS DIRECTED 4 TIMES A DAY 05/11/18   Copland, Skipper Dumas, MD  lisinopril  (ZESTRIL ) 40 MG tablet TAKE 1 TABLET BY MOUTH EVERY DAY 08/26/21   Copland, Jessica C, MD  MAGNESIUM PO Take 1 tablet by mouth daily.    [provider]  metFORMIN  (GLUCOPHAGE ) 1000 MG tablet Take 1 tablet (1,000 mg total) by mouth 2 (two) times daily with a meal. 07/23/20   Copland, Skipper Dumas, MD  pantoprazole  (PROTONIX ) 40 MG tablet Take 1 tablet (40 mg total) by mouth daily before breakfast. 09/19/20   Copland, Jessica C, MD  triamcinolone  ointment (KENALOG ) 0.5 % Apply 1 application topically 2 (two) times daily. Use as needed for rash on feet 07/23/20   Copland, Skipper Dumas, MD    Family History Family History  Problem Relation Age of Onset   Hypertension Mother    Diabetes Father    Stroke Father    Hypertension Sister    Asthma Son  Social History Social History   Tobacco Use   Smoking status: Every Day    Current packs/day: 1.00    Average packs/day: 1 pack/day for 15.0 years (15.0 ttl pk-yrs)    Types: Cigarettes   Smokeless tobacco: Never  Vaping Use   Vaping status: Never Used  Substance Use Topics   Alcohol use: No    Alcohol/week: 0.0 standard drinks of alcohol   Drug use: No     Allergies   Patient has no known allergies.   Review of Systems Review of Systems   Physical Exam Triage Vital Signs ED Triage Vitals [10/05/23 1154]  Encounter Vitals Group     BP 124/78     Systolic BP Percentile      Diastolic BP Percentile      Pulse Rate 85     Resp 18     Temp 98.1 F (36.7 C)     Temp Source Oral     SpO2 95 %     Weight      Height      Head  Circumference      Peak Flow      Pain Score 6     Pain Loc      Pain Education      Exclude from Growth Chart    No data found.  Updated Vital Signs BP 124/78 (BP Location: Left Arm)   Pulse 85   Temp 98.1 F (36.7 C) (Oral)   Resp 18   SpO2 95%   Visual Acuity Right Eye Distance:   Left Eye Distance:   Bilateral Distance:    Right Eye Near:   Left Eye Near:    Bilateral Near:     Physical Exam Constitutional:      Appearance: Normal appearance.  Musculoskeletal:     Comments: Inspection reveals swelling of the left anke at ATFL.  There is tenderness to palpation over the ATFL as well as the deltoid ligament.  Inversion eversion testing does elicit pain.  Ankle drawer test stable.  Neurological:     Mental Status: He is alert.      UC Treatments / Results  Labs (all labs ordered are listed, but only abnormal results are displayed) Labs Reviewed - No data to display  EKG   Radiology No results found.  Procedures Procedures (including critical care time)  Medications Ordered in UC Medications - No data to display  Initial Impression / Assessment and Plan / UC Course  I have reviewed the triage vital signs and the nursing notes.  Pertinent labs & imaging results that were available during my care of the patient were reviewed by me and considered in my medical decision making (see chart for details).     Patient's x-rays show no significant concern for fracture.  There is some degenerative changes of the ankle noted.  At this time, we will go ahead and treat as a grade 3 ankle sprain with an ASO brace and meloxicam.  Also advised patient to put ice on ankle daily at least twice a day Final Clinical Impressions(s) / UC Diagnoses   Final diagnoses:  Acute left ankle pain     Discharge Instructions      Please take your meloxicam daily for the next 7 days and you can take it as needed afterwards.  Please not take any ibuprofen if you are taking your  meloxicam.  Please continue to wear your brace whenever you are standing for the next 2  weeks.   ED Prescriptions     Medication Sig Dispense Auth. Provider   meloxicam (MOBIC) 15 MG tablet Take 1 tablet daily with food for 7 days. Then take as needed. 14 tablet Yoshua Geisinger, MD      PDMP not reviewed this encounter.   Jude Norton, MD 10/05/23 1308

## 2023-10-05 NOTE — ED Triage Notes (Signed)
 Pt states running after his dog yesterday and twisted lt ankle. States can't bare weight to lt ankle.

## 2023-10-05 NOTE — Discharge Instructions (Signed)
 Please take your meloxicam daily for the next 7 days and you can take it as needed afterwards.  Please not take any ibuprofen if you are taking your meloxicam.  Please continue to wear your brace whenever you are standing for the next 2 weeks.

## 2023-10-06 ENCOUNTER — Ambulatory Visit (HOSPITAL_COMMUNITY): Payer: Self-pay
